# Patient Record
Sex: Female | Born: 1960
Health system: Southern US, Community
[De-identification: ages and names within clinical notes are randomized; demographics above are authoritative.]

## PROBLEM LIST (undated history)

## (undated) DIAGNOSIS — I10 Essential (primary) hypertension: Secondary | ICD-10-CM

## (undated) DIAGNOSIS — C801 Malignant (primary) neoplasm, unspecified: Secondary | ICD-10-CM

## (undated) DIAGNOSIS — F419 Anxiety disorder, unspecified: Secondary | ICD-10-CM

## (undated) HISTORY — PX: ABDOMINAL HYSTERECTOMY: SHX81

---

## 1998-04-23 HISTORY — PX: BREAST SURGERY: SHX581

## 1998-04-23 HISTORY — PX: BREAST EXCISIONAL BIOPSY: SUR124

## 1998-04-23 HISTORY — PX: BREAST LUMPECTOMY: SHX2

## 2000-07-26 ENCOUNTER — Encounter (INDEPENDENT_AMBULATORY_CARE_PROVIDER_SITE_OTHER): Payer: Self-pay | Admitting: Specialist

## 2000-07-26 ENCOUNTER — Ambulatory Visit (HOSPITAL_COMMUNITY): Admission: RE | Admit: 2000-07-26 | Discharge: 2000-07-26 | Payer: Self-pay | Admitting: General Surgery

## 2000-08-27 ENCOUNTER — Encounter (HOSPITAL_BASED_OUTPATIENT_CLINIC_OR_DEPARTMENT_OTHER): Payer: Self-pay | Admitting: General Surgery

## 2000-08-29 ENCOUNTER — Encounter (INDEPENDENT_AMBULATORY_CARE_PROVIDER_SITE_OTHER): Payer: Self-pay | Admitting: Specialist

## 2000-08-29 ENCOUNTER — Encounter (HOSPITAL_BASED_OUTPATIENT_CLINIC_OR_DEPARTMENT_OTHER): Payer: Self-pay | Admitting: General Surgery

## 2000-08-29 ENCOUNTER — Ambulatory Visit (HOSPITAL_COMMUNITY): Admission: RE | Admit: 2000-08-29 | Discharge: 2000-08-29 | Payer: Self-pay | Admitting: General Surgery

## 2000-10-02 ENCOUNTER — Ambulatory Visit: Admission: RE | Admit: 2000-10-02 | Discharge: 2000-12-31 | Payer: Self-pay | Admitting: Radiation Oncology

## 2001-04-23 HISTORY — PX: BREAST BIOPSY: SHX20

## 2002-01-05 ENCOUNTER — Encounter: Admission: RE | Admit: 2002-01-05 | Discharge: 2002-01-05 | Payer: Self-pay | Admitting: Family Medicine

## 2002-01-05 ENCOUNTER — Encounter: Payer: Self-pay | Admitting: Family Medicine

## 2004-08-27 ENCOUNTER — Emergency Department (HOSPITAL_COMMUNITY): Admission: EM | Admit: 2004-08-27 | Discharge: 2004-08-27 | Payer: Self-pay | Admitting: Family Medicine

## 2005-02-14 ENCOUNTER — Other Ambulatory Visit: Admission: RE | Admit: 2005-02-14 | Discharge: 2005-02-14 | Payer: Self-pay | Admitting: Family Medicine

## 2005-02-27 ENCOUNTER — Encounter: Admission: RE | Admit: 2005-02-27 | Discharge: 2005-02-27 | Payer: Self-pay | Admitting: Family Medicine

## 2005-02-27 ENCOUNTER — Encounter (INDEPENDENT_AMBULATORY_CARE_PROVIDER_SITE_OTHER): Payer: Self-pay | Admitting: Specialist

## 2005-02-27 ENCOUNTER — Encounter (INDEPENDENT_AMBULATORY_CARE_PROVIDER_SITE_OTHER): Payer: Self-pay | Admitting: Diagnostic Radiology

## 2005-03-13 ENCOUNTER — Encounter: Admission: RE | Admit: 2005-03-13 | Discharge: 2005-03-13 | Payer: Self-pay | Admitting: General Surgery

## 2005-03-20 ENCOUNTER — Encounter: Admission: RE | Admit: 2005-03-20 | Discharge: 2005-03-20 | Payer: Self-pay | Admitting: Family Medicine

## 2005-04-03 ENCOUNTER — Encounter: Admission: RE | Admit: 2005-04-03 | Discharge: 2005-04-03 | Payer: Self-pay | Admitting: General Surgery

## 2005-04-25 ENCOUNTER — Ambulatory Visit (HOSPITAL_BASED_OUTPATIENT_CLINIC_OR_DEPARTMENT_OTHER): Admission: RE | Admit: 2005-04-25 | Discharge: 2005-04-25 | Payer: Self-pay | Admitting: General Surgery

## 2005-04-25 ENCOUNTER — Encounter (HOSPITAL_BASED_OUTPATIENT_CLINIC_OR_DEPARTMENT_OTHER): Payer: Self-pay | Admitting: General Surgery

## 2005-04-25 ENCOUNTER — Encounter (INDEPENDENT_AMBULATORY_CARE_PROVIDER_SITE_OTHER): Payer: Self-pay | Admitting: *Deleted

## 2005-05-22 ENCOUNTER — Ambulatory Visit: Admission: RE | Admit: 2005-05-22 | Discharge: 2005-05-31 | Payer: Self-pay | Admitting: Radiation Oncology

## 2005-06-13 ENCOUNTER — Ambulatory Visit: Payer: Self-pay | Admitting: Oncology

## 2005-06-18 ENCOUNTER — Ambulatory Visit: Admission: RE | Admit: 2005-06-18 | Discharge: 2005-07-06 | Payer: Self-pay | Admitting: Radiation Oncology

## 2005-07-17 ENCOUNTER — Ambulatory Visit: Admission: RE | Admit: 2005-07-17 | Discharge: 2005-09-21 | Payer: Self-pay | Admitting: Radiation Oncology

## 2006-04-24 ENCOUNTER — Emergency Department (HOSPITAL_COMMUNITY): Admission: EM | Admit: 2006-04-24 | Discharge: 2006-04-24 | Payer: Self-pay | Admitting: Family Medicine

## 2006-11-29 ENCOUNTER — Encounter: Admission: RE | Admit: 2006-11-29 | Discharge: 2006-11-29 | Payer: Self-pay | Admitting: General Surgery

## 2006-12-17 ENCOUNTER — Other Ambulatory Visit: Admission: RE | Admit: 2006-12-17 | Discharge: 2006-12-17 | Payer: Self-pay | Admitting: Family Medicine

## 2008-11-09 ENCOUNTER — Encounter: Admission: RE | Admit: 2008-11-09 | Discharge: 2008-11-09 | Payer: Self-pay | Admitting: Family Medicine

## 2008-11-11 ENCOUNTER — Encounter: Admission: RE | Admit: 2008-11-11 | Discharge: 2008-11-11 | Payer: Self-pay | Admitting: Family Medicine

## 2009-07-20 ENCOUNTER — Other Ambulatory Visit: Admission: RE | Admit: 2009-07-20 | Discharge: 2009-07-20 | Payer: Self-pay | Admitting: Family Medicine

## 2010-05-13 ENCOUNTER — Encounter: Payer: Self-pay | Admitting: General Surgery

## 2010-05-13 ENCOUNTER — Encounter: Payer: Self-pay | Admitting: Family Medicine

## 2010-09-08 NOTE — Op Note (Signed)
NAMEKENLYN, LOSE             ACCOUNT NO.:  [id number]   MEDICAL RECORD NO.:  [id number]          PATIENT TYPE:  AMB   LOCATION:  DSC                          FACILITY:  [facility]   PHYSICIAN:  Leonie Man, M.D.   DATE OF BIRTH:  October 22, 1960   DATE OF PROCEDURE:  04/25/2005  DATE OF DISCHARGE:                                 OPERATIVE REPORT   PREOPERATIVE DIAGNOSIS:  Carcinoma left breast.   POSTOPERATIVE DIAGNOSIS:  Carcinoma left breast.   PROCEDURE:  Partial mastectomy, left and left sentinel lymph node biopsy.   SURGEON:  Ballen   ASSISTANT:  OR nurse.   ANESTHESIA:  General.   NOTE:  The patient is a 50 year old woman who in 2002 underwent a partial  mastectomy and sentinel lymph node biopsies of the right breast. She was on  tamoxifen for short period of time and underwent breast radiation therapy.  She returns now with a palpable mass within the left breast which on biopsy  shows an invasive carcinoma. She comes to the operating room now after the  risks and potential benefits of surgery have been discussed, all questions  answered and consent obtained.   Following induction of satisfactory general anesthesia, the patient is again  positively identified and left breast identified. The left breast was then  prepped and draped to be included in a sterile operative field. Transverse  incision is carried down just above the mass. This was deepened, raising  flaps superiorly, inferiorly and dissecting around the entire mass, carrying  the dissection down to the chest wall. The entire wedge of breast tissue was  removed in its entirety and forwarded for pathologic evaluation. Touch Prep  on the margin showed no evidence of carcinoma. Hemostasis was obtained  within the incision and attention then turned to the left axilla.  With the  use of the NeoProbe, a hot spot within the axilla was identified and a  transverse axillary incision was made, deepened through skin and  subcutaneous tissue with dissection carried down to the lymph node which was  blue and hot with counts of greater than 2000. This was dissected free,  removed and forwarded for pathologic evaluation. No additional lymph nodes  above background could be identified within the axilla. Touch Preps on the  node were negative for carcinoma. Hemostasis was assured within both wounds.  The axillary wound closed first with deep sutures placed with 2-0 Vicryl  suture and then the skin was closed with 5-0 Monocryl suture. The breast  incision itself was also closed in two layers with 2-0 Vicryl sutures and  then 5-0 Monocryl suture in the subcuticular a position. The wounds then  reinforced forced with Steri-Strips and sterile compressive dressings  applied after Marcaine 0.25% with epinephrine was injected both into the  axilla and into the breast around the incisions.  After dressings were  applied, the patient was removed from the operating room to the recovery  room in stable condition. She tolerated the procedure well.      Leonie Man, M.D.  Electronically Signed     PB/MEDQ  D:  04/25/2005  T:  04/25/2005  Job:  [id number]   cc:   Renaye Rakers, M.D.  Fax: [phone]

## 2010-09-08 NOTE — Op Note (Signed)
[facility]  Patient:    Allison Rivera, Allison Rivera                    MRN: [id number] Proc. Date: 08/29/00 Adm. Date:  [id number] Attending:  Sonda Primes                           Operative Report  PREOPERATIVE DIAGNOSIS:  Carcinoma, right breast.  POSTOPERATIVE DIAGNOSIS:  Carcinoma, right breast.  PROCEDURE:  Sentinel lymph node biopsy.  SURGEON:  Mardene Celeste. Lurene Shadow, M.D.  ASSISTANT:  Nurse.  ANESTHESIA:  General.  CLINICAL NOTE:  The patient is a 50 year old woman status post biopsy of a lesion in the axillary tail of the right breast, which returned as grade 1 carcinoma.  She returns to the operating room now for a sentinel lymph node biopsy, possible axillary lymph node dissection.  DESCRIPTION OF PROCEDURE:  Following the induction of anesthesia, the patient, who had been previously injected with Technetium sulfur colloid and then injected with lymphazurin dye in the periareolar region and having this area massaged for approximately five minutes.  The right breast and axilla are prepped and draped to be included in the sterile operative field.  I used the NeoProbe to locate and map the hot spot within the axilla.  I made an incision over the axillary hot spot and deepened this through the skin and subcutaneous tissue down to the region where a cluster of blue lymph nodes was noted.  The largest of the lymph nodes was dissected free and removed.  It was radioactive.  Three additional nodes were also radioactive, and these too were dissected free and forwarded for pathologic evaluation.  The large and primary node was treated by touch prep and was noted to be negative for metastatic tumor.  Hemostasis was obtained within the wound.  Sponge, instrument, and sharp counts were verified.  The subcutaneous tissue was closed with interrupted 3-0 Vicryl sutures and the skin closed with 5-0 Monocryl suture. The wounds was reinforced with  Steri-Strips and sterile dressings applied. Anesthetic reversed.  The patient removed from the operating room to the recovery room in stable condition, having tolerated the procedure well. DD:  08/29/00 TD:  08/29/00 Job: [id number]

## 2010-10-05 ENCOUNTER — Other Ambulatory Visit: Payer: Self-pay | Admitting: Family Medicine

## 2010-10-05 ENCOUNTER — Other Ambulatory Visit (HOSPITAL_COMMUNITY)
Admission: RE | Admit: 2010-10-05 | Discharge: 2010-10-05 | Disposition: A | Discharge status: Home / Self Care | Payer: BC Managed Care – PPO | Source: Ambulatory Visit | Attending: Family Medicine | Admitting: Family Medicine

## 2010-10-05 DIAGNOSIS — Z01419 Encounter for gynecological examination (general) (routine) without abnormal findings: Secondary | ICD-10-CM | POA: Insufficient documentation

## 2010-10-10 ENCOUNTER — Other Ambulatory Visit: Payer: Self-pay | Admitting: Family Medicine

## 2010-10-10 DIAGNOSIS — N939 Abnormal uterine and vaginal bleeding, unspecified: Secondary | ICD-10-CM

## 2010-10-13 ENCOUNTER — Other Ambulatory Visit: Payer: Self-pay

## 2010-10-13 ENCOUNTER — Inpatient Hospital Stay: Admission: RE | Admit: 2010-10-13 | Payer: Self-pay | Source: Ambulatory Visit

## 2010-10-20 ENCOUNTER — Other Ambulatory Visit: Payer: BC Managed Care – PPO

## 2011-04-24 HISTORY — PX: COLONOSCOPY: SHX174

## 2011-11-02 ENCOUNTER — Other Ambulatory Visit: Payer: Self-pay | Admitting: Family Medicine

## 2011-11-02 DIAGNOSIS — Z1231 Encounter for screening mammogram for malignant neoplasm of breast: Secondary | ICD-10-CM

## 2011-11-07 ENCOUNTER — Ambulatory Visit
Admission: RE | Admit: 2011-11-07 | Discharge: 2011-11-07 | Disposition: A | Discharge status: Home / Self Care | Payer: BC Managed Care – PPO | Source: Ambulatory Visit | Attending: Family Medicine | Admitting: Family Medicine

## 2011-11-07 DIAGNOSIS — Z1231 Encounter for screening mammogram for malignant neoplasm of breast: Secondary | ICD-10-CM

## 2011-11-15 ENCOUNTER — Other Ambulatory Visit: Payer: Self-pay | Admitting: Obstetrics and Gynecology

## 2011-11-23 ENCOUNTER — Encounter (HOSPITAL_COMMUNITY): Payer: Self-pay

## 2011-12-05 ENCOUNTER — Encounter (HOSPITAL_COMMUNITY)
Admission: RE | Admit: 2011-12-05 | Discharge: 2011-12-05 | Disposition: A | Discharge status: Home / Self Care | Payer: BC Managed Care – PPO | Source: Ambulatory Visit | Attending: Obstetrics and Gynecology | Admitting: Obstetrics and Gynecology

## 2011-12-05 ENCOUNTER — Encounter (HOSPITAL_COMMUNITY): Payer: Self-pay

## 2011-12-05 HISTORY — DX: Malignant (primary) neoplasm, unspecified: C80.1

## 2011-12-05 HISTORY — DX: Anxiety disorder, unspecified: F41.9

## 2011-12-05 HISTORY — DX: Essential (primary) hypertension: I10

## 2011-12-05 LAB — CBC
HCT: 41.7 % (ref 36.0–46.0)
MCH: 27.9 pg (ref 26.0–34.0)
MCHC: 32.1 g/dL (ref 30.0–36.0)
MCV: 86.7 fL (ref 78.0–100.0)
Platelets: 371 10*3/uL (ref 150–400)
RBC: 4.81 MIL/uL (ref 3.87–5.11)
RDW: 13.3 % (ref 11.5–15.5)
WBC: 8.8 10*3/uL (ref 4.0–10.5)

## 2011-12-05 LAB — BASIC METABOLIC PANEL
BUN: 15 mg/dL (ref 6–23)
CO2: 31 mEq/L (ref 19–32)
Chloride: 102 mEq/L (ref 96–112)
Creatinine, Ser: 0.84 mg/dL (ref 0.50–1.10)
GFR calc Af Amer: >90 mL/min (ref >90)
GFR calc non Af Amer: 79 mL/min — ABNORMAL LOW (ref >90)
Glucose, Bld: 98 mg/dL (ref 70–99)
Potassium: 4.3 mEq/L (ref 3.5–5.1)
Sodium: 141 mEq/L (ref 135–145)

## 2011-12-05 NOTE — Patient Instructions (Signed)
Your procedure is scheduled on:12/07/11  Enter through the Main Entrance at :1045am Pick up desk phone and dial [phone] and inform us of your arrival.  Please call [phone] if you have any problems the morning of surgery.  Remember: Do not eat after midnight:Thursday Do not drink after:water ok until 8am on Fri  Take these meds the morning of surgery with a sip of water:none  DO NOT wear jewelry, eye make-up, lipstick,body lotion, or dark fingernail polish. Do not shave for 48 hours prior to surgery.  If you are to be admitted after surgery, leave suitcase in car until your room has been assigned. Patients discharged on the day of surgery will not be allowed to drive home.   Remember to use your Hibiclens as instructed.

## 2011-12-07 ENCOUNTER — Encounter (HOSPITAL_COMMUNITY): Payer: Self-pay | Admitting: Anesthesiology

## 2011-12-07 ENCOUNTER — Ambulatory Visit (HOSPITAL_COMMUNITY)
Admission: RE | Admit: 2011-12-07 | Discharge: 2011-12-07 | Disposition: A | Discharge status: Home / Self Care | Payer: BC Managed Care – PPO | Source: Ambulatory Visit | Attending: Obstetrics and Gynecology | Admitting: Obstetrics and Gynecology

## 2011-12-07 ENCOUNTER — Encounter (HOSPITAL_COMMUNITY)
Admission: RE | Disposition: A | Discharge status: Home / Self Care | Payer: Self-pay | Source: Ambulatory Visit | Attending: Obstetrics and Gynecology

## 2011-12-07 ENCOUNTER — Ambulatory Visit (HOSPITAL_COMMUNITY): Payer: BC Managed Care – PPO | Admitting: Anesthesiology

## 2011-12-07 DIAGNOSIS — N924 Excessive bleeding in the premenopausal period: Secondary | ICD-10-CM

## 2011-12-07 DIAGNOSIS — Z01818 Encounter for other preprocedural examination: Secondary | ICD-10-CM | POA: Insufficient documentation

## 2011-12-07 DIAGNOSIS — R9389 Abnormal findings on diagnostic imaging of other specified body structures: Secondary | ICD-10-CM | POA: Insufficient documentation

## 2011-12-07 DIAGNOSIS — Z01812 Encounter for preprocedural laboratory examination: Secondary | ICD-10-CM | POA: Insufficient documentation

## 2011-12-07 HISTORY — PX: DILATION AND CURETTAGE OF UTERUS: SHX78

## 2011-12-07 SURGERY — DILATATION & CURETTAGE/HYSTEROSCOPY WITH VERSAPOINT RESECTION
Anesthesia: General | Site: Vagina | Wound class: Clean Contaminated

## 2011-12-07 MED ORDER — LIDOCAINE HCL (CARDIAC) 20 MG/ML IV SOLN
INTRAVENOUS | Status: DC | PRN
  Administered 2011-12-07: 80 mg via INTRAVENOUS

## 2011-12-07 MED ORDER — DEXAMETHASONE SODIUM PHOSPHATE 10 MG/ML IJ SOLN
INTRAMUSCULAR | Status: AC
  Filled 2011-12-07: qty 1

## 2011-12-07 MED ORDER — FENTANYL CITRATE 0.05 MG/ML IJ SOLN
INTRAMUSCULAR | Status: AC
  Filled 2011-12-07: qty 2

## 2011-12-07 MED ORDER — MIDAZOLAM HCL 2 MG/2ML IJ SOLN
INTRAMUSCULAR | Status: AC
  Filled 2011-12-07: qty 2

## 2011-12-07 MED ORDER — KETOROLAC TROMETHAMINE 60 MG/2ML IM SOLN
INTRAMUSCULAR | Status: AC
  Filled 2011-12-07: qty 2

## 2011-12-07 MED ORDER — KETOROLAC TROMETHAMINE 30 MG/ML IJ SOLN
INTRAMUSCULAR | Status: DC | PRN
  Administered 2011-12-07: 60 mg via INTRAVENOUS

## 2011-12-07 MED ORDER — PROPOFOL 10 MG/ML IV EMUL
INTRAVENOUS | Status: DC | PRN
  Administered 2011-12-07: 170 mg via INTRAVENOUS

## 2011-12-07 MED ORDER — LIDOCAINE HCL (CARDIAC) 20 MG/ML IV SOLN
INTRAVENOUS | Status: AC
  Filled 2011-12-07: qty 5

## 2011-12-07 MED ORDER — PROPOFOL 10 MG/ML IV EMUL
INTRAVENOUS | Status: AC
  Filled 2011-12-07: qty 20

## 2011-12-07 MED ORDER — CHLOROPROCAINE HCL 1 % IJ SOLN
INTRAMUSCULAR | Status: AC
  Filled 2011-12-07: qty 30

## 2011-12-07 MED ORDER — PHENYLEPHRINE HCL 10 MG/ML IJ SOLN
INTRAMUSCULAR | Status: DC | PRN
  Administered 2011-12-07 (×3): 40 ug via INTRAVENOUS
  Administered 2011-12-07: 80 ug via INTRAVENOUS

## 2011-12-07 MED ORDER — FENTANYL CITRATE 0.05 MG/ML IJ SOLN
25.0000 ug | INTRAMUSCULAR | Status: DC | PRN
  Administered 2011-12-07: 50 ug via INTRAVENOUS

## 2011-12-07 MED ORDER — DEXAMETHASONE SODIUM PHOSPHATE 4 MG/ML IJ SOLN
INTRAMUSCULAR | Status: DC | PRN
Start: 2011-12-07 — End: 2011-12-07
  Administered 2011-12-07: 10 mg via INTRAVENOUS

## 2011-12-07 MED ORDER — GLYCINE 1.5 % IR SOLN
Status: DC | PRN
  Administered 2011-12-07: 3000 mL

## 2011-12-07 MED ORDER — LACTATED RINGERS IV SOLN
INTRAVENOUS | Status: DC
  Administered 2011-12-07 (×4): via INTRAVENOUS

## 2011-12-07 MED ORDER — ONDANSETRON HCL 4 MG/2ML IJ SOLN
INTRAMUSCULAR | Status: AC
  Filled 2011-12-07: qty 2

## 2011-12-07 MED ORDER — ONDANSETRON HCL 4 MG/2ML IJ SOLN
INTRAMUSCULAR | Status: DC | PRN
  Administered 2011-12-07: 4 mg via INTRAVENOUS

## 2011-12-07 MED ORDER — IBUPROFEN 800 MG PO TABS: 800.0000 mg | ORAL_TABLET | Freq: Three times a day (TID) | ORAL | Status: AC | PRN

## 2011-12-07 MED ORDER — MIDAZOLAM HCL 5 MG/5ML IJ SOLN
INTRAMUSCULAR | Status: DC | PRN
  Administered 2011-12-07: 2 mg via INTRAVENOUS

## 2011-12-07 MED ORDER — FENTANYL CITRATE 0.05 MG/ML IJ SOLN
INTRAMUSCULAR | Status: DC | PRN
  Administered 2011-12-07 (×2): 50 ug via INTRAVENOUS

## 2011-12-07 MED ORDER — CHLOROPROCAINE HCL 1 % IJ SOLN
INTRAMUSCULAR | Status: DC | PRN
  Administered 2011-12-07: 20 mL

## 2011-12-07 MED ORDER — FENTANYL CITRATE 0.05 MG/ML IJ SOLN
INTRAMUSCULAR | Status: AC
  Administered 2011-12-07: 50 ug via INTRAVENOUS
  Filled 2011-12-07: qty 2

## 2011-12-07 SURGICAL SUPPLY — 17 items
CANISTER SUCTION 2500CC (MISCELLANEOUS) ×2 IMPLANT
CATH ROBINSON RED A/P 16FR (CATHETERS) ×2 IMPLANT
CLOTH BEACON ORANGE TIMEOUT ST (SAFETY) ×2 IMPLANT
CONTAINER PREFILL 10% NBF 60ML (FORM) ×4 IMPLANT
ELECT REM PT RETURN 9FT ADLT (ELECTROSURGICAL) ×2
ELECTRODE REM PT RTRN 9FT ADLT (ELECTROSURGICAL) ×1 IMPLANT
ELECTRODE ROLLER VERSAPOINT (ELECTRODE) ×1 IMPLANT
ELECTRODE RT ANGLE VERSAPOINT (CUTTING LOOP) IMPLANT
GLOVE BIO SURGEON STRL SZ 6.5 (GLOVE) ×2 IMPLANT
GLOVE BIOGEL PI IND STRL 7.0 (GLOVE) ×2 IMPLANT
GLOVE BIOGEL PI INDICATOR 7.0 (GLOVE) ×2
GOWN PREVENTION PLUS LG XLONG (DISPOSABLE) ×4 IMPLANT
GOWN STRL REIN XL XLG (GOWN DISPOSABLE) ×2 IMPLANT
LOOP ANGLED CUTTING 22FR (CUTTING LOOP) IMPLANT
PACK HYSTEROSCOPY LF (CUSTOM PROCEDURE TRAY) ×2 IMPLANT
TOWEL OR 17X24 6PK STRL BLUE (TOWEL DISPOSABLE) ×4 IMPLANT
WATER STERILE IRR 1000ML POUR (IV SOLUTION) ×2 IMPLANT

## 2011-12-07 NOTE — Anesthesia Preprocedure Evaluation (Signed)
Anesthesia Evaluation  Patient identified by MRN, date of birth, ID band Patient awake    Reviewed: Allergy & Precautions, H&P , Patient's Chart, lab work & pertinent test results, reviewed documented beta blocker date and time   Airway Mallampati: II TM Distance: >3 FB Neck ROM: full    Dental No notable dental hx.    Pulmonary  breath sounds clear to auscultation  Pulmonary exam normal       Cardiovascular hypertension (well controlled), Pt. on medications Rhythm:regular Rate:Normal     Neuro/Psych    GI/Hepatic   Endo/Other    Renal/GU      Musculoskeletal   Abdominal   Peds  Hematology   Anesthesia Other Findings   Reproductive/Obstetrics                           Anesthesia Physical Anesthesia Plan  ASA: II  Anesthesia Plan: General   Post-op Pain Management:    Induction: Intravenous  Airway Management Planned: LMA  Additional Equipment:   Intra-op Plan:   Post-operative Plan:   Informed Consent: I have reviewed the patients History and Physical, chart, labs and discussed the procedure including the risks, benefits and alternatives for the proposed anesthesia with the patient or authorized representative who has indicated his/her understanding and acceptance.   Dental Advisory Given  Plan Discussed with: CRNA and Surgeon  Anesthesia Plan Comments: (  Discussed  general anesthesia, including possible nausea, instrumentation of airway, sore throat,pulmonary aspiration, etc. I asked if the were any outstanding questions, or  concerns before we proceeded. )        Anesthesia Quick Evaluation

## 2011-12-07 NOTE — Brief Op Note (Signed)
12/07/2011  1:12 PM  PATIENT:  Allison Rivera  51 y.o. female  PRE-OPERATIVE DIAGNOSIS:  Perimenopausal Bleeding, Endometrial Polyps, endometrial thickening  POST-OPERATIVE DIAGNOSIS:   Perimenopausal menopausal bleeding, endometrial thickening  PROCEDURE:  Procedure(s) (LRB): DILATATION & CURETTAGE,   DIAGNOSTIC  HYSTEROSCOPY WITH VERSAPOINT RESECTION (N/A)  SURGEON:  Surgeon(s) and Role:    * Ilija Maxim A Daxten Kovalenko, MD - Primary  PHYSICIAN ASSISTANT:   ASSISTANTS: none   ANESTHESIA:   general and paracervical block  EBL:  Total I/O In: 1400 [I.V.:1400] Out: 20 [Blood:20] FINDINGS: diffuse thickened polypoid lesions noted with tortuous blood vessels. Large amount of tissue retrieved BLOOD ADMINISTERED:none  DRAINS: none   LOCAL MEDICATIONS USED:  OTHER 1% nesicaine  SPECIMEN:  Source of Specimen:  emc  DISPOSITION OF SPECIMEN:  PATHOLOGY  COUNTS:  YES  TOURNIQUET:  * No tourniquets in log *  DICTATION: .Other Dictation: Dictation Number [id number]  PLAN OF CARE: Discharge to home after PACU  PATIENT DISPOSITION:  PACU - hemodynamically stable.   Delay start of Pharmacological VTE agent (>24hrs) due to surgical blood loss or risk of bleeding: no

## 2011-12-07 NOTE — Anesthesia Postprocedure Evaluation (Signed)
  Anesthesia Post-op Note  Patient: Allison Rivera  Procedure(s) Performed: Procedure(s) (LRB): DILATATION & CURETTAGE/HYSTEROSCOPY WITH VERSAPOINT RESECTION (N/A)  Patient is awake and responsive. Pain and nausea are reasonably well controlled. Vital signs are stable and clinically acceptable. Oxygen saturation is clinically acceptable. There are no apparent anesthetic complications at this time. Patient is ready for discharge.

## 2011-12-07 NOTE — Preoperative (Signed)
Beta Blockers   Reason not to administer Beta Blockers:Not Applicable 

## 2011-12-07 NOTE — Transfer of Care (Signed)
Immediate Anesthesia Transfer of Care Note  Patient: Allison Rivera  Procedure(s) Performed: Procedure(s) (LRB): DILATATION & CURETTAGE/HYSTEROSCOPY WITH VERSAPOINT RESECTION (N/A)  Patient Location: PACU  Anesthesia Type: General  Level of Consciousness: awake, alert , oriented and patient cooperative  Airway & Oxygen Therapy: Patient Spontanous Breathing and Patient connected to nasal cannula oxygen  Post-op Assessment: Report given to PACU RN and Post -op Vital signs reviewed and stable  Post vital signs: Reviewed and stable  Complications: No apparent anesthesia complications

## 2011-12-08 NOTE — Op Note (Signed)
NAMEBLAKELEY, Allison Rivera NO.:  [id number]  MEDICAL RECORD NO.:  [id number]  LOCATION:  [facility]                          FACILITY:  [facility]  PHYSICIAN:  Maxie Better, M.D.DATE OF BIRTH:  [birth year]  DATE OF PROCEDURE:  12/07/2011 DATE OF DISCHARGE:  12/07/2011                              OPERATIVE REPORT   PREOPERATIVE DIAGNOSIS:  Abnormal perimenopausal bleeding, endometrial thickening.  PROCEDURE:  Diagnostic hysteroscopy, dilation and curettage.  POSTOPERATIVE DIAGNOSIS:  Abnormal perimenopausal bleeding, endometrial thickening.  ANESTHESIA:  General, paracervical block.  SURGEON:  Maxie Better, MD.  ASSISTANT:  None.  PROCEDURE:  Under adequate general anesthesia, the patient was placed in the dorsal lithotomy position.  She was sterilely prepped and draped in usual fashion.  The bladder was catheterized and minimal amount of urine.  Examination under anesthesia revealed an anteverted uterus.  No adnexal masses could be appreciated.  A bivalve speculum was placed in the vagina.  1% Nesacaine was injected paracervically at 3 and 9 o'clock.  The anterior lip of the cervix grasped with a single-tooth tenaculum.  The cervix was then serially dilated up to #31 [facility] dilator.  Versapoint apparatus introduced into the uterine cavity.  At that point, there were diffuse polypoid lesions in the endometrial cavity with a prominent large irregular blood vessels noted.  There were no areas of normalcy seen.  The hysteroscope was removed.  The cavity was curetted for a large amount of tissue.  The hysteroscope was then reinserted and additional tissue was there, however, given the appearance of irregular polypoid diffuse with multiple abnormal appearing vessels, decision was then made not to proceed with aggressive curettage of the uterine cavity.  Therefore, the resectoscope was removed.  The tenaculum was removed.  All instruments from the vagina were removed  and specimen labeled.  Endometrial curetting was sent to pathology.  COMPLICATION:  None.  The patient tolerated the procedure well and was transferred to recovery in stable condition.     Maxie Better, M.D.    [place]/MEDQ  D:  12/07/2011  T:  12/08/2011  Job:  [id number]

## 2011-12-20 ENCOUNTER — Encounter: Payer: Self-pay | Admitting: Gynecologic Oncology

## 2011-12-20 ENCOUNTER — Ambulatory Visit: Payer: BC Managed Care – PPO | Attending: Gynecologic Oncology | Admitting: Gynecologic Oncology

## 2011-12-20 VITALS — BP 118/60 | HR 90 | Temp 98.2°F | Resp 22 | Ht 69.0 in | Wt 141.5 lb

## 2011-12-20 DIAGNOSIS — C50919 Malignant neoplasm of unspecified site of unspecified female breast: Secondary | ICD-10-CM | POA: Insufficient documentation

## 2011-12-20 DIAGNOSIS — C541 Malignant neoplasm of endometrium: Secondary | ICD-10-CM | POA: Insufficient documentation

## 2011-12-20 DIAGNOSIS — I10 Essential (primary) hypertension: Secondary | ICD-10-CM | POA: Insufficient documentation

## 2011-12-20 DIAGNOSIS — C549 Malignant neoplasm of corpus uteri, unspecified: Secondary | ICD-10-CM | POA: Insufficient documentation

## 2011-12-20 NOTE — Progress Notes (Addendum)
Consult Note: Gyn-Onc  Allison Rivera 51 y.o. female  CC:  Chief Complaint  Patient presents with  . Endometrial cancer    New consult    HPI: Patient is seen today in consultation at the request of Dr. Cherly Hensen.  Allison Rivera is a very pleasant 51 year old gravida 1 para 1 who in February of this year turning the age of 51 she began noticing that her cycles are becoming more sporadic and somewhat irregular. She then began having some irregular spotting. She was seen by Dr. Parke Simmers and had a negative exam and negative Pap smear. About 3 months ago the spotting became more persistent and she was bleeding for 3 weeks. She was scheduled to see Dr. Cherly Hensen on but missed that appointment. She then saw Dr. Parke Simmers with these concerns and was rescheduled with Dr. Cherly Hensen. An ultrasound was performed that revealed 3 small uterine fibroids. The fibrous measured approximately 1 x 2 cm each. The uterine size overall was 9.7 x 7.4 x 5.6 cm. However, the endometrium was irregular with a 2.5 cm in thickness. Due to this, the patient underwent D&C on August 16. She also had a diagnostic hysteroscopy. At the time of the hysteroscopy there was diffuse polypoid lesion in the endometrial cavity with prominent irregular blood vessels noted. Pathology returned as a grade 1 endometrioid adenocarcinoma. It is for this reason that she comes in to see [place] today.  Interval History:  She has only had slight spotting since her D&C otherwise is without complaints.  Review of Systems: Review of systems is negative.  Current Meds:  Outpatient Encounter Prescriptions as of 12/20/2011  Medication Sig Dispense Refill  . acetaminophen (TYLENOL) 325 MG tablet Take 650 mg by mouth daily as needed. headache      . Aliskiren-Hydrochlorothiazide (TEKTURNA HCT) 300-25 MG TABS Take 1 tablet by mouth daily.      Marland Kitchen ALPRAZolam (XANAX) 1 MG tablet Take 1 mg by mouth 3 (three) times daily.      Marland Kitchen amLODipine (NORVASC) 5 MG tablet Take 5 mg  by mouth daily.      . Multiple Vitamin (MULTIVITAMIN WITH MINERALS) TABS Take 1 tablet by mouth daily.        Allergy: No Known Allergies  Social Hx:   History   Social History  . Marital Status: Divorced    Spouse Name: N/A    Number of Children: N/A  . Years of Education: N/A   Occupational History  . Not on file.   Social History Main Topics  . Smoking status: Never Smoker   . Smokeless tobacco: Not on file  . Alcohol Use: 0.6 oz/week    1 Glasses of wine per week     occasionally  . Drug Use: No  . Sexually Active: Yes   Other Topics Concern  . Not on file   Social History Narrative  . No narrative on file    Past Surgical Hx: Had a resection of an abdominal tumor when she was 10-11 years. She does not know that it was for. She then subsequently approximately 25 years ago had a plastic surgery repair of the incision secondary to a keloid. Past Surgical History  Procedure Date  . Breast surgery 2000    lumpectomy  . Breast biopsy 2003  . Colonoscopy 2013    Past Medical Hx:  Past Medical History  Diagnosis Date  . Hypertension   . Anxiety   . Cancer     breast  Family Hx: No family history on file. She has a sister with breast cancer at age of 68. She has 3 maternal aunts with breast cancer and 2 maternal first cousins with breast cancer. She is not aware that anyone had any genetic testing. She is up-to-date on her mammograms and 20 months ago that was negative. She had a colonoscopy in February of 2013 that was benign. She did have 2 benign polyps. It was recommended that she have followup in 5 years.  Vitals:  Blood pressure 118/60, pulse 90, temperature 98.2 F (36.8 C), temperature source Oral, resp. rate 22, height 5\' 9"  (1.753 m), weight 141 lb 8 oz (64.184 kg).  Physical Exam: Arched well-developed female in no acute distress.  Neck: Supple, no lymphadenopathy no thyromegaly.  Lungs: Clear to auscultation bilaterally.  Cardiovascular:  Regular rate and rhythm.  Abdomen: Well-healed transverse incision. Abdomen is soft nontender nondistended no palpable mass or prostatomegaly.  Extremities: No edema.  Pelvic: Normal external female genitalia. Vagina is slightly atrophic. The cervix is nulliparous. There are no visible lesions. Bimanual examination the cervix is palpably normal. The corpus is of normal size shape and consistency there is no adnexal masses.  Assessment/Plan:  51 year old with grade 1 endometrioid adenocarcinoma. Options for surgery including hysterectomy BSO and appropriate staging were discussed the patient. We discussed proceeding with laparotomy versus minimally invasive surgery. She will be most interested in minimally invasive surgery. The earliest surgical date that we have available in [place] is September 10. However, as she has met me she would like to defer surgery September 24 so that I will be here in [place].  We discussed the surgical procedure and at the uterus, the cervix, bilateral fallopian tubes and ovaries would be removed. While she is under anesthesia, the uterus will be sent for frozen section. If there's less than 50% myometrial invasion she will not require full lymphadenectomy. If there's greater than 50% myometrial invasion will perform a full lymphadenectomy at the time of her initial surgery. She understands that there is about a 5% risk of needing to convert to laparotomy. Risks and benefits of surgery including but not limited to bleeding, infection, injury to surrounding organs, and  thromboembolic disease were discussed with the patient. Her questions were elicited and answered to her satisfaction. She wishes to proceed. Telford Nab RN reviewed preoperative procedures with the patient. She has our cards and will call us if she has any questions. We appreciate the opportunity to partner in the care of this patient. Cinthya Bors A., MD 12/20/2011, 3:56 PM   Please refer to the  change in the history.  The patient was initially seen by Dr. Parke Simmers and referred to Dr. Cherly Hensen.  The patient did not keep the initial consult with Dr. Cherly Hensen and was re-referred to her office where appropriate biopsies were performed.

## 2011-12-20 NOTE — Patient Instructions (Signed)
Return for pre-operative visit

## 2011-12-21 ENCOUNTER — Other Ambulatory Visit: Payer: Self-pay | Admitting: Obstetrics and Gynecology

## 2012-01-08 ENCOUNTER — Encounter (HOSPITAL_COMMUNITY): Payer: Self-pay | Admitting: Pharmacy Technician

## 2012-01-11 ENCOUNTER — Encounter (HOSPITAL_COMMUNITY): Payer: Self-pay

## 2012-01-11 ENCOUNTER — Encounter (HOSPITAL_COMMUNITY)
Admission: RE | Admit: 2012-01-11 | Discharge: 2012-01-11 | Disposition: A | Discharge status: Home / Self Care | Payer: BC Managed Care – PPO | Source: Ambulatory Visit | Attending: Obstetrics & Gynecology | Admitting: Obstetrics & Gynecology

## 2012-01-11 ENCOUNTER — Ambulatory Visit (HOSPITAL_COMMUNITY)
Admission: RE | Admit: 2012-01-11 | Discharge: 2012-01-11 | Disposition: A | Discharge status: Home / Self Care | Payer: BC Managed Care – PPO | Source: Ambulatory Visit | Attending: Gynecologic Oncology | Admitting: Gynecologic Oncology

## 2012-01-11 DIAGNOSIS — C549 Malignant neoplasm of corpus uteri, unspecified: Secondary | ICD-10-CM | POA: Insufficient documentation

## 2012-01-11 DIAGNOSIS — I10 Essential (primary) hypertension: Secondary | ICD-10-CM | POA: Insufficient documentation

## 2012-01-11 DIAGNOSIS — Z01812 Encounter for preprocedural laboratory examination: Secondary | ICD-10-CM | POA: Insufficient documentation

## 2012-01-11 DIAGNOSIS — M4120 Other idiopathic scoliosis, site unspecified: Secondary | ICD-10-CM | POA: Insufficient documentation

## 2012-01-11 LAB — COMPREHENSIVE METABOLIC PANEL
ALT: 15 U/L (ref 0–35)
AST: 20 U/L (ref 0–37)
CO2: 31 mEq/L (ref 19–32)
Calcium: 9.9 mg/dL (ref 8.4–10.5)
GFR calc non Af Amer: 83 mL/min — ABNORMAL LOW (ref >90)
Sodium: 141 mEq/L (ref 135–145)
Total Protein: 7.8 g/dL (ref 6.0–8.3)

## 2012-01-11 LAB — SURGICAL PCR SCREEN
MRSA, PCR: NEGATIVE
Staphylococcus aureus: POSITIVE — AB

## 2012-01-11 LAB — CBC WITH DIFFERENTIAL/PLATELET
Basophils Absolute: 0 10*3/uL (ref 0.0–0.1)
Eosinophils Relative: 2 % (ref 0–5)
Lymphocytes Relative: 26 % (ref 12–46)
MCV: 85 fL (ref 78.0–100.0)
Platelets: 380 10*3/uL (ref 150–400)
RDW: 13.4 % (ref 11.5–15.5)
WBC: 12.1 10*3/uL — ABNORMAL HIGH (ref 4.0–10.5)

## 2012-01-11 LAB — ABO/RH: ABO/RH(D): B NEG

## 2012-01-11 NOTE — Patient Instructions (Signed)
20      Your procedure is scheduled on:  Tuesday 01/15/2012 at 0715 am  Report to [facility] at  0515 AM.  Call this number if you have problems the morning of surgery: [phone]   Remember:   Do not eat food or drink liquids after midnight!  Take these medicines the morning of surgery with A SIP OF WATER:Xanax if needed    Do not bring valuables to the hospital.  .  Leave suitcase in the car. After surgery it may be brought to your room.  For patients admitted to the hospital, checkout time is 11:00 AM the day of              Discharge.    Special Instructions: See [facility] Preparing  For Surgery Instruction Sheet. Do not wear jewelry, lotions powders, perfumes. Women do not shave legs or underarms for 12 hours before showers. Contacts, partial plates, or dentures may not be worn into surgery.                          Patients discharged the day of surgery will not be allowed to drive home. If going home the same day of surgery, must have someone stay with you  first 24 hrs.at home and arrange for someone to drive you home from the              Hospital.   Please read over the following fact sheets that you were given: MRSA              INFORMATION, Blood transfusion sheet, Incentive Spirometry sheet               Telford Nab.Qamar Rosman,RN,BSN [phone]

## 2012-01-14 NOTE — Anesthesia Preprocedure Evaluation (Addendum)
Anesthesia Evaluation  Patient identified by MRN, date of birth, ID band Patient awake    Reviewed: Allergy & Precautions, H&P , NPO status , Patient's Chart, lab work & pertinent test results, reviewed documented beta blocker date and time   Airway Mallampati: II TM Distance: >3 FB Neck ROM: full    Dental No notable dental hx.    Pulmonary neg pulmonary ROS,  breath sounds clear to auscultation  Pulmonary exam normal       Cardiovascular Exercise Tolerance: Good hypertension, On Medications Rhythm:regular Rate:Normal  CXR and ECG reviewed.   Neuro/Psych negative neurological ROS  negative psych ROS   GI/Hepatic negative GI ROS, Neg liver ROS,   Endo/Other  negative endocrine ROS  Renal/GU negative Renal ROS  negative genitourinary   Musculoskeletal negative musculoskeletal ROS (+)   Abdominal   Peds negative pediatric ROS (+)  Hematology negative hematology ROS (+)   Anesthesia Other Findings   Reproductive/Obstetrics negative OB ROS                          Anesthesia Physical Anesthesia Plan  ASA: II  Anesthesia Plan: General ETT   Post-op Pain Management:    Induction:   Airway Management Planned:   Additional Equipment:   Intra-op Plan:   Post-operative Plan:   Informed Consent: I have reviewed the patients History and Physical, chart, labs and discussed the procedure including the risks, benefits and alternatives for the proposed anesthesia with the patient or authorized representative who has indicated his/her understanding and acceptance.   Dental Advisory Given  Plan Discussed with: CRNA  Anesthesia Plan Comments:         Anesthesia Quick Evaluation

## 2012-01-15 ENCOUNTER — Encounter (HOSPITAL_COMMUNITY): Payer: Self-pay | Admitting: *Deleted

## 2012-01-15 ENCOUNTER — Encounter (HOSPITAL_COMMUNITY): Payer: Self-pay | Admitting: Anesthesiology

## 2012-01-15 ENCOUNTER — Ambulatory Visit (HOSPITAL_COMMUNITY)
Admission: RE | Admit: 2012-01-15 | Discharge: 2012-01-16 | Disposition: A | Discharge status: Home / Self Care | Payer: BC Managed Care – PPO | Source: Ambulatory Visit | Attending: Obstetrics & Gynecology | Admitting: Obstetrics & Gynecology

## 2012-01-15 ENCOUNTER — Encounter (HOSPITAL_COMMUNITY)
Admission: RE | Disposition: A | Discharge status: Home / Self Care | Payer: Self-pay | Source: Ambulatory Visit | Attending: Obstetrics & Gynecology

## 2012-01-15 ENCOUNTER — Ambulatory Visit (HOSPITAL_COMMUNITY): Payer: BC Managed Care – PPO | Admitting: Anesthesiology

## 2012-01-15 DIAGNOSIS — D259 Leiomyoma of uterus, unspecified: Secondary | ICD-10-CM | POA: Insufficient documentation

## 2012-01-15 DIAGNOSIS — C541 Malignant neoplasm of endometrium: Secondary | ICD-10-CM

## 2012-01-15 DIAGNOSIS — N952 Postmenopausal atrophic vaginitis: Secondary | ICD-10-CM | POA: Insufficient documentation

## 2012-01-15 DIAGNOSIS — C549 Malignant neoplasm of corpus uteri, unspecified: Secondary | ICD-10-CM | POA: Insufficient documentation

## 2012-01-15 DIAGNOSIS — Z79899 Other long term (current) drug therapy: Secondary | ICD-10-CM | POA: Insufficient documentation

## 2012-01-15 HISTORY — PX: ROBOTIC ASSISTED TOTAL HYSTERECTOMY WITH BILATERAL SALPINGO OOPHERECTOMY: SHX6086

## 2012-01-15 HISTORY — PX: LYMPH NODE DISSECTION: SHX5087

## 2012-01-15 LAB — TYPE AND SCREEN

## 2012-01-15 SURGERY — ROBOTIC ASSISTED TOTAL HYSTERECTOMY WITH BILATERAL SALPINGO OOPHORECTOMY
Anesthesia: General | Wound class: Clean Contaminated

## 2012-01-15 MED ORDER — CEFAZOLIN SODIUM-DEXTROSE 2-3 GM-% IV SOLR
2.0000 g | INTRAVENOUS | Status: AC
  Administered 2012-01-15: 2 g via INTRAVENOUS

## 2012-01-15 MED ORDER — MORPHINE SULFATE 2 MG/ML IJ SOLN: 2.0000 mg | INTRAMUSCULAR | Status: DC | PRN

## 2012-01-15 MED ORDER — ONDANSETRON HCL 4 MG PO TABS: 4.0000 mg | ORAL_TABLET | Freq: Four times a day (QID) | ORAL | Status: DC | PRN

## 2012-01-15 MED ORDER — ONDANSETRON HCL 4 MG/2ML IJ SOLN
INTRAMUSCULAR | Status: DC | PRN
  Administered 2012-01-15: 4 mg via INTRAVENOUS

## 2012-01-15 MED ORDER — HYDROMORPHONE HCL PF 1 MG/ML IJ SOLN
INTRAMUSCULAR | Status: AC
  Filled 2012-01-15: qty 2

## 2012-01-15 MED ORDER — ACETAMINOPHEN 10 MG/ML IV SOLN
INTRAVENOUS | Status: AC
  Filled 2012-01-15: qty 100

## 2012-01-15 MED ORDER — KETOROLAC TROMETHAMINE 30 MG/ML IJ SOLN
30.0000 mg | Freq: Four times a day (QID) | INTRAMUSCULAR | Status: AC
  Filled 2012-01-15: qty 1

## 2012-01-15 MED ORDER — HYDROMORPHONE HCL PF 1 MG/ML IJ SOLN
0.2500 mg | INTRAMUSCULAR | Status: DC | PRN
  Administered 2012-01-15 (×2): 0.5 mg via INTRAVENOUS

## 2012-01-15 MED ORDER — ALPRAZOLAM 1 MG PO TABS: 1.0000 mg | ORAL_TABLET | Freq: Three times a day (TID) | ORAL | Status: DC | PRN

## 2012-01-15 MED ORDER — AMLODIPINE BESYLATE 5 MG PO TABS
5.0000 mg | ORAL_TABLET | Freq: Every day | ORAL | Status: DC
  Administered 2012-01-15: 5 mg via ORAL
  Filled 2012-01-15 (×2): qty 1

## 2012-01-15 MED ORDER — ONDANSETRON HCL 4 MG/2ML IJ SOLN: 4.0000 mg | Freq: Four times a day (QID) | INTRAMUSCULAR | Status: DC | PRN

## 2012-01-15 MED ORDER — CISATRACURIUM BESYLATE (PF) 10 MG/5ML IV SOLN
INTRAVENOUS | Status: DC | PRN
  Administered 2012-01-15: 2 mg via INTRAVENOUS
  Administered 2012-01-15: 14 mg via INTRAVENOUS

## 2012-01-15 MED ORDER — LACTATED RINGERS IV SOLN
INTRAVENOUS | Status: DC
  Administered 2012-01-15 (×2): via INTRAVENOUS

## 2012-01-15 MED ORDER — KETOROLAC TROMETHAMINE 30 MG/ML IJ SOLN
INTRAMUSCULAR | Status: AC
  Filled 2012-01-15: qty 1

## 2012-01-15 MED ORDER — NEOSTIGMINE METHYLSULFATE 1 MG/ML IJ SOLN
INTRAMUSCULAR | Status: DC | PRN
  Administered 2012-01-15: 4 mg via INTRAVENOUS

## 2012-01-15 MED ORDER — PROPOFOL 10 MG/ML IV BOLUS
INTRAVENOUS | Status: DC | PRN
  Administered 2012-01-15: 150 mg via INTRAVENOUS

## 2012-01-15 MED ORDER — PROMETHAZINE HCL 25 MG/ML IJ SOLN: 6.2500 mg | INTRAMUSCULAR | Status: DC | PRN

## 2012-01-15 MED ORDER — CEFAZOLIN SODIUM-DEXTROSE 2-3 GM-% IV SOLR
INTRAVENOUS | Status: AC
  Filled 2012-01-15: qty 50

## 2012-01-15 MED ORDER — HYDROMORPHONE HCL PF 1 MG/ML IJ SOLN
INTRAMUSCULAR | Status: DC | PRN
  Administered 2012-01-15 (×2): .2 mg via INTRAVENOUS
  Administered 2012-01-15: .6 mg via INTRAVENOUS

## 2012-01-15 MED ORDER — KETOROLAC TROMETHAMINE 30 MG/ML IJ SOLN
30.0000 mg | Freq: Four times a day (QID) | INTRAMUSCULAR | Status: AC
  Administered 2012-01-15 – 2012-01-16 (×3): 30 mg via INTRAVENOUS
  Filled 2012-01-15: qty 1

## 2012-01-15 MED ORDER — KCL IN DEXTROSE-NACL 20-5-0.45 MEQ/L-%-% IV SOLN
INTRAVENOUS | Status: DC
  Administered 2012-01-15 – 2012-01-16 (×3): via INTRAVENOUS
  Filled 2012-01-15 (×5): qty 1000

## 2012-01-15 MED ORDER — KCL IN DEXTROSE-NACL 20-5-0.45 MEQ/L-%-% IV SOLN
INTRAVENOUS | Status: AC
  Filled 2012-01-15: qty 1000

## 2012-01-15 MED ORDER — LACTATED RINGERS IV SOLN
INTRAVENOUS | Status: DC | PRN
  Administered 2012-01-15: 1000 mL

## 2012-01-15 MED ORDER — LIDOCAINE HCL (CARDIAC) 20 MG/ML IV SOLN
INTRAVENOUS | Status: DC | PRN
  Administered 2012-01-15: 100 mg via INTRAVENOUS

## 2012-01-15 MED ORDER — ZOLPIDEM TARTRATE 5 MG PO TABS: 5.0000 mg | ORAL_TABLET | Freq: Every evening | ORAL | Status: DC | PRN

## 2012-01-15 MED ORDER — STERILE WATER FOR IRRIGATION IR SOLN
Status: DC | PRN
  Administered 2012-01-15: 1500 mL

## 2012-01-15 MED ORDER — DEXAMETHASONE SODIUM PHOSPHATE 10 MG/ML IJ SOLN
INTRAMUSCULAR | Status: DC | PRN
  Administered 2012-01-15: 10 mg via INTRAVENOUS

## 2012-01-15 MED ORDER — GLYCOPYRROLATE 0.2 MG/ML IJ SOLN
INTRAMUSCULAR | Status: DC | PRN
  Administered 2012-01-15: .6 mg via INTRAVENOUS

## 2012-01-15 MED ORDER — SUFENTANIL CITRATE 50 MCG/ML IV SOLN
INTRAVENOUS | Status: DC | PRN
  Administered 2012-01-15 (×2): 20 ug via INTRAVENOUS
  Administered 2012-01-15: 10 ug via INTRAVENOUS

## 2012-01-15 MED ORDER — LABETALOL HCL 5 MG/ML IV SOLN
INTRAVENOUS | Status: DC | PRN
  Administered 2012-01-15 (×2): 5 mg via INTRAVENOUS

## 2012-01-15 MED ORDER — OXYCODONE-ACETAMINOPHEN 5-325 MG PO TABS
1.0000 | ORAL_TABLET | ORAL | Status: DC | PRN
  Administered 2012-01-15: 1 via ORAL
  Filled 2012-01-15: qty 1

## 2012-01-15 MED ORDER — MIDAZOLAM HCL 5 MG/5ML IJ SOLN
INTRAMUSCULAR | Status: DC | PRN
  Administered 2012-01-15: 2 mg via INTRAVENOUS

## 2012-01-15 MED ORDER — ACETAMINOPHEN 10 MG/ML IV SOLN
INTRAVENOUS | Status: DC | PRN
  Administered 2012-01-15: 1000 mg via INTRAVENOUS

## 2012-01-15 SURGICAL SUPPLY — 50 items
APL SKNCLS STERI-STRIP NONHPOA (GAUZE/BANDAGES/DRESSINGS)
BAG SPEC RTRVL LRG 6X4 10 (ENDOMECHANICALS) ×2
BENZOIN TINCTURE PRP APPL 2/3 (GAUZE/BANDAGES/DRESSINGS) ×2 IMPLANT
CHLORAPREP W/TINT 26ML (MISCELLANEOUS) ×3 IMPLANT
CLOTH BEACON ORANGE TIMEOUT ST (SAFETY) ×3 IMPLANT
CORD HIGH FREQUENCY UNIPOLAR (ELECTROSURGICAL) ×3 IMPLANT
CORDS BIPOLAR (ELECTRODE) ×3 IMPLANT
COVER MAYO STAND STRL (DRAPES) ×3 IMPLANT
COVER SURGICAL LIGHT HANDLE (MISCELLANEOUS) ×3 IMPLANT
COVER TIP SHEARS 8 DVNC (MISCELLANEOUS) ×2 IMPLANT
COVER TIP SHEARS 8MM DA VINCI (MISCELLANEOUS) ×1
DECANTER SPIKE VIAL GLASS SM (MISCELLANEOUS) ×2 IMPLANT
DRAPE LG THREE QUARTER DISP (DRAPES) ×6 IMPLANT
DRAPE SURG IRRIG POUCH 19X23 (DRAPES) ×3 IMPLANT
DRAPE TABLE BACK 44X90 PK DISP (DRAPES) ×3 IMPLANT
DRAPE UTILITY XL STRL (DRAPES) ×3 IMPLANT
DRAPE WARM FLUID 44X44 (DRAPE) ×3 IMPLANT
DRSG TEGADERM 6X8 (GAUZE/BANDAGES/DRESSINGS) ×6 IMPLANT
ELECT REM PT RETURN 9FT ADLT (ELECTROSURGICAL) ×3
ELECTRODE REM PT RTRN 9FT ADLT (ELECTROSURGICAL) ×2 IMPLANT
GAUZE VASELINE 3X9 (GAUZE/BANDAGES/DRESSINGS) IMPLANT
GLOVE BIO SURGEON STRL SZ 6.5 (GLOVE) ×12 IMPLANT
GLOVE BIO SURGEON STRL SZ7.5 (GLOVE) ×5 IMPLANT
GLOVE BIOGEL PI IND STRL 7.0 (GLOVE) ×4 IMPLANT
GLOVE BIOGEL PI INDICATOR 7.0 (GLOVE) ×2
GOWN PREVENTION PLUS XLARGE (GOWN DISPOSABLE) ×13 IMPLANT
HOLDER FOLEY CATH W/STRAP (MISCELLANEOUS) ×3 IMPLANT
KIT ACCESSORY DA VINCI DISP (KITS) ×1
KIT ACCESSORY DVNC DISP (KITS) ×2 IMPLANT
MANIPULATOR UTERINE 4.5 ZUMI (MISCELLANEOUS) ×3 IMPLANT
OCCLUDER COLPOPNEUMO (BALLOONS) ×3 IMPLANT
PACK LAPAROSCOPY W LONG (CUSTOM PROCEDURE TRAY) ×3 IMPLANT
POUCH SPECIMEN RETRIEVAL 10MM (ENDOMECHANICALS) ×5 IMPLANT
SET TUBE IRRIG SUCTION NO TIP (IRRIGATION / IRRIGATOR) ×3 IMPLANT
SHEET LAVH (DRAPES) ×3 IMPLANT
SOLUTION ELECTROLUBE (MISCELLANEOUS) ×3 IMPLANT
SPONGE LAP 18X18 X RAY DECT (DISPOSABLE) ×1 IMPLANT
STRIP CLOSURE SKIN 1/2X4 (GAUZE/BANDAGES/DRESSINGS) ×2 IMPLANT
SUT VIC AB 0 CT1 27 (SUTURE) ×3
SUT VIC AB 0 CT1 27XBRD ANTBC (SUTURE) ×6 IMPLANT
SUT VIC AB 4-0 PS2 27 (SUTURE) ×6 IMPLANT
SUT VICRYL 0 UR6 27IN ABS (SUTURE) ×3 IMPLANT
SYR 50ML LL SCALE MARK (SYRINGE) ×3 IMPLANT
SYR BULB IRRIGATION 50ML (SYRINGE) IMPLANT
TRAP SPECIMEN MUCOUS 40CC (MISCELLANEOUS) ×2 IMPLANT
TRAY FOLEY CATH 14FRSI W/METER (CATHETERS) ×3 IMPLANT
TROCAR XCEL 12X100 BLDLESS (ENDOMECHANICALS) ×3 IMPLANT
TROCAR XCEL BLADELESS 5X75MML (TROCAR) ×3 IMPLANT
TUBING FILTER THERMOFLATOR (ELECTROSURGICAL) ×2 IMPLANT
WATER STERILE IRR 1500ML POUR (IV SOLUTION) ×4 IMPLANT

## 2012-01-15 NOTE — H&P (View-Only) (Signed)
Consult Note: Gyn-Onc  Allison Rivera 51 y.o. female  CC:  Chief Complaint  Patient presents with  . Endometrial cancer    New consult    HPI: Patient is seen today in consultation at the request of Dr. Cousins.  Allison Rivera is a very pleasant 51-year-old gravida 1 para 1 who in February of this year turning the age of 51 she began noticing that her cycles are becoming more sporadic and somewhat irregular. She then began having some irregular spotting. She was seen by Dr. Bland and had a negative exam and negative Pap smear. About 3 months ago the spotting became more persistent and she was bleeding for 3 weeks. She was scheduled to see Dr. Cousins on but missed that appointment. She then saw Dr. Bland with these concerns and was rescheduled with Dr. Cousins. An ultrasound was performed that revealed 3 small uterine fibroids. The fibrous measured approximately 1 x 2 cm each. The uterine size overall was 9.7 x 7.4 x 5.6 cm. However, the endometrium was irregular with a 2.5 cm in thickness. Due to this, the patient underwent D&C on August 16. She also had a diagnostic hysteroscopy. At the time of the hysteroscopy there was diffuse polypoid lesion in the endometrial cavity with prominent irregular blood vessels noted. Pathology returned as a grade 1 endometrioid adenocarcinoma. It is for this reason that she comes in to see us today.  Interval History:  She has only had slight spotting since her D&C otherwise is without complaints.  Review of Systems: Review of systems is negative.  Current Meds:  Outpatient Encounter Prescriptions as of 12/20/2011  Medication Sig Dispense Refill  . acetaminophen (TYLENOL) 325 MG tablet Take 650 mg by mouth daily as needed. headache      . Aliskiren-Hydrochlorothiazide (TEKTURNA HCT) 300-25 MG TABS Take 1 tablet by mouth daily.      . ALPRAZolam (XANAX) 1 MG tablet Take 1 mg by mouth 3 (three) times daily.      . amLODipine (NORVASC) 5 MG tablet Take 5 mg  by mouth daily.      . Multiple Vitamin (MULTIVITAMIN WITH MINERALS) TABS Take 1 tablet by mouth daily.        Allergy: No Known Allergies  Social Hx:   History   Social History  . Marital Status: Divorced    Spouse Name: N/A    Number of Children: N/A  . Years of Education: N/A   Occupational History  . Not on file.   Social History Main Topics  . Smoking status: Never Smoker   . Smokeless tobacco: Not on file  . Alcohol Use: 0.6 oz/week    1 Glasses of wine per week     occasionally  . Drug Use: No  . Sexually Active: Yes   Other Topics Concern  . Not on file   Social History Narrative  . No narrative on file    Past Surgical Hx: Had a resection of an abdominal tumor when she was 10-11 years. She does not know that it was for. She then subsequently approximately 25 years ago had a plastic surgery repair of the incision secondary to a keloid. Past Surgical History  Procedure Date  . Breast surgery 2000    lumpectomy  . Breast biopsy 2003  . Colonoscopy 2013    Past Medical Hx:  Past Medical History  Diagnosis Date  . Hypertension   . Anxiety   . Cancer     breast      Family Hx: No family history on file. She has a sister with breast cancer at age of 48. She has 3 maternal aunts with breast cancer and 2 maternal first cousins with breast cancer. She is not aware that anyone had any genetic testing. She is up-to-date on her mammograms and 20 months ago that was negative. She had a colonoscopy in February of 2013 that was benign. She did have 2 benign polyps. It was recommended that she have followup in 5 years.  Vitals:  Blood pressure 118/60, pulse 90, temperature 98.2 F (36.8 C), temperature source Oral, resp. rate 22, height 5' 9" (1.753 m), weight 141 lb 8 oz (64.184 kg).  Physical Exam: Arched well-developed female in no acute distress.  Neck: Supple, no lymphadenopathy no thyromegaly.  Lungs: Clear to auscultation bilaterally.  Cardiovascular:  Regular rate and rhythm.  Abdomen: Well-healed transverse incision. Abdomen is soft nontender nondistended no palpable mass or prostatomegaly.  Extremities: No edema.  Pelvic: Normal external female genitalia. Vagina is slightly atrophic. The cervix is nulliparous. There are no visible lesions. Bimanual examination the cervix is palpably normal. The corpus is of normal size shape and consistency there is no adnexal masses.  Assessment/Plan:  51 year old with grade 1 endometrioid adenocarcinoma. Options for surgery including hysterectomy BSO and appropriate staging were discussed the patient. We discussed proceeding with laparotomy versus minimally invasive surgery. She will be most interested in minimally invasive surgery. The earliest surgical date that we have available in [place] is September 10. However, as she has met me she would like to defer surgery September 24 so that I will be here in [place].  We discussed the surgical procedure and at the uterus, the cervix, bilateral fallopian tubes and ovaries would be removed. While she is under anesthesia, the uterus will be sent for frozen section. If there's less than 50% myometrial invasion she will not require full lymphadenectomy. If there's greater than 50% myometrial invasion will perform a full lymphadenectomy at the time of her initial surgery. She understands that there is about a 5% risk of needing to convert to laparotomy. Risks and benefits of surgery including but not limited to bleeding, infection, injury to surrounding organs, and  thromboembolic disease were discussed with the patient. Her questions were elicited and answered to her satisfaction. She wishes to proceed. Nancy Wilkinson RN reviewed preoperative procedures with the patient. She has our cards and will call us if she has any questions. We appreciate the opportunity to partner in the care of this patient. Cylis Ayars A., MD 12/20/2011, 3:56 PM   Please refer to the  change in the history.  The patient was initially seen by Dr. Bland and referred to Dr. Cousins.  The patient did not keep the initial consult with Dr. Cousins and was re-referred to her office where appropriate biopsies were performed. 

## 2012-01-15 NOTE — Anesthesia Postprocedure Evaluation (Signed)
  Anesthesia Post-op Note  Patient: Allison Rivera  Procedure(s) Performed: Procedure(s) (LRB): ROBOTIC ASSISTED TOTAL HYSTERECTOMY WITH BILATERAL SALPINGO OOPHERECTOMY (N/A) LYMPH NODE DISSECTION ()  Patient Location: PACU  Anesthesia Type: General  Level of Consciousness: awake and alert   Airway and Oxygen Therapy: Patient Spontanous Breathing  Post-op Pain: mild  Post-op Assessment: Post-op Vital signs reviewed, Patient's Cardiovascular Status Stable, Respiratory Function Stable, Patent Airway and No signs of Nausea or vomiting  Post-op Vital Signs: stable  Complications: No apparent anesthesia complications

## 2012-01-15 NOTE — Interval H&P Note (Signed)
History and Physical Interval Note:  01/15/2012 7:07 AM  Allison Rivera  has presented today for surgery, with the diagnosis of endometrial cancer  The various methods of treatment have been discussed with the patient and family. After consideration of risks, benefits and other options for treatment, the patient has consented to  Procedure(s) (LRB) with comments: ROBOTIC ASSISTED TOTAL HYSTERECTOMY WITH BILATERAL SALPINGO OOPHERECTOMY (N/A) - Possible lymph nodes ( PLEASE REQUEST [facility] ON PATHOLOGY REQUISITION PER PHYSICIAN ) as a surgical intervention .  The patient's history has been reviewed, patient examined, no change in status, stable for surgery.  I have reviewed the patient's chart and labs.  Questions were answered to the patient's satisfaction.  We may speak with her family after the surgery is completed.   Monterius Rolf A.

## 2012-01-15 NOTE — Preoperative (Signed)
Beta Blockers   Reason not to administer Beta Blockers:Not Applicable 

## 2012-01-15 NOTE — Anesthesia Procedure Notes (Signed)
Procedure Name: Intubation Date/Time: 01/15/2012 7:27 AM Performed by: Leroy Libman L Patient Re-evaluated:Patient Re-evaluated prior to inductionOxygen Delivery Method: Circle system utilized Preoxygenation: Pre-oxygenation with 100% oxygen Intubation Type: IV induction Ventilation: Mask ventilation without difficulty and Oral airway inserted - appropriate to patient size Laryngoscope Size: Hyacinth Meeker and 2 Grade View: Grade I Tube type: Oral Tube size: 7.5 mm Number of attempts: 1 Airway Equipment and Method: Stylet Placement Confirmation: ETT inserted through vocal cords under direct vision,  breath sounds checked- equal and bilateral and positive ETCO2 Secured at: 23 cm Tube secured with: Tape Dental Injury: Teeth and Oropharynx as per pre-operative assessment

## 2012-01-15 NOTE — Op Note (Signed)
PATIENT: [name] DATE OF BIRTH: 12-Sep-1960 ENCOUNTER DATE: 01/15/2012   Preop Diagnosis: grade 1 endometrioid adenocarcinoma.   Postoperative Diagnosis: same.   Surgery: Total robotic hysterectomy bilateral salpingo-oophorectomy, bilateral pelvic lymph node dissection.   Surgeons:  Bernita Buffy. Duard Brady, MD; Antionette Char, MD   Assistant: Telford Nab   Anesthesia: General   Estimated blood loss: 25 ml   IVF: 1700 ml   Urine output:  150 ml   Complications: None   Pathology: Uterus, cervix, bilateral tubes and ovaries, bilateral pelvic lymph nodes to pathology.   Operative findings: Globular fibroid uterus. Frozen section revealed a grade 1 endometrial adenocarcinoma with minimal myometrial invasion and adenomyosis.   Procedure: The patient was identified in the preoperative holding area. Informed consent was signed on the chart. Patient was seen history was reviewed and exam was performed.   The patient was then taken to the operating room and placed in the supine position with SCD hose on. General anesthesia was then induced without difficulty. She was then placed in the dorsolithotomy position. Her arms were tucked at her side with appropriate precautions on the gel pad. Shoulder blocks were then placed in the usual fashion with appropriate precautions. A OG-tube was placed to suction. First timeout was performed to confirm the patient procedure antibiotic allergy status, estimated blood loss and OR time. The perineum was then prepped in the usual fashion with Betadine. A 14 French Foley was inserted into the bladder under sterile conditions. A sterile speculum was placed in the vagina. The cervix was without lesions. The cervix was grasped with a single-tooth tenaculum. The dilator without difficulty. A ZUMI with a large Koe ring was placed without difficulty. The abdomen was then prepped with 1 Chlor prep sponges per protocol.   Patient was then draped after the prep  was dried. Second timeout was performed to confirm the above. After again confirming OG tube placement and it was to suction. A stab-wound was made in left upper quadrant 2 cm below the costal margin on the left in the midclavicular line. A 5 mm operative report was used to assure intra-abdominal placement. The abdomen was insufflated. At this point all points during the procedure the patient's intra-abdominal pressure was not increased over 15 mm of mercury. After insufflation was complete, the patient was placed in deep Trendelenburg position. 25 cm above the pubic symphysis that area was marked the camera port. Bilateral robotic ports were marked 10 cm from the midline incision at approximately 5 angle. Under direct visualization each of the trochars was placed into the abdomen. The small bowel was folded on its mesentery to allow visualization to the pelvis. The 5 mm LUQ port was then converted to a 10/12 port under direct visualization.  After assuring adequate visualization, the robot was then docked in the usual fashion. Under direct visualization the robotic instruments replaced.   The round ligament on the patient's right side was transected with monopolar cautery. The anterior and posterior leaves of the broad ligament were then taken down in the usual fashion. The ureter was identified on the medial leaf of the broad ligament. A window was made between the IP and the ureter. The IP was coagulated with bipolar cautery and transected. The posterior leaf of the broad ligament was taken down to the level of the KOH ring. The bladder flap was created using meticulous dissection and pinpoint cautery. The uterine vessels were coagulated with bipolar cautery. The uterine vessels were then transected and the C loop  was created. The same procedure was performed on the patient's left side.   The pneumo-occulder in the vagina was then insufflated. The colpotomy was then created in the usual fashion. The specimen  was then delivered to the vagina and sent for frozen section. Our attention was then drawn to opening the paravesical space on her right side the perirectal space was also opened. The obturator nerve was identified. The nodal bundle extending over the external iliac artery down to the external iliac vein was taken down using sharp dissection and monopolar cautery. The genitofemoral nerve was identified and spared. We continued our dissection down to the level of the obturator nerve. The nodal bundle superior to the obturator nerve was taken. All pedicles were noted to be hemostatic the ureter was noted to be well medial of the area of dissection. The nodal bundle was then placed and an Endo catch bag. The same procedure was performed on the patient's left side.   All specimens were delivered to the vagina. At this point frozen section returned as minimal myometrial invasion of grade 1 lesion. The decision was made to stop the procedure is no further lymphadenectomy was indicated.   The vaginal cuff was closed with a running 0 Vicryl on CT 1 suture. The abdomen and pelvis were copiously irrigated and noted to be hemostatic. The robotic instruments were removed under direct visualization as were the robotic trochars. The pneumoperitoneum was removed. The patient was then taken out of the Trendelenburg position. Using of 0 Vicryl on a UR 6 needle the midline port port fascia and the fascia in the left upper quadrant port were reapproximated. The skin was closed using 4-0 Vicryl. Steri-Strips and benzoin were applied. The pneumo occluded balloon was removed from the vagina. The vagina was swabbed and noted to be hemostatic.   All instrument needle and Ray-Tec counts were correct x2. The patient tolerated the procedure well and was taken to the recovery room in stable condition. This is [name] dictating an operative note on patient Allison Rivera.

## 2012-01-15 NOTE — Transfer of Care (Signed)
Immediate Anesthesia Transfer of Care Note  Patient: Allison Rivera  Procedure(s) Performed: Procedure(s) (LRB) with comments: ROBOTIC ASSISTED TOTAL HYSTERECTOMY WITH BILATERAL SALPINGO OOPHERECTOMY (N/A) -  lymph nodes ( PLEASE REQUEST [facility] ON PATHOLOGY REQUISITION PER PHYSICIAN ) LYMPH NODE DISSECTION ()  Patient Location: PACU  Anesthesia Type: General  Level of Consciousness: oriented and sedated  Airway & Oxygen Therapy: Patient Spontanous Breathing and Patient connected to face mask oxygen  Post-op Assessment: Report given to PACU RN and Post -op Vital signs reviewed and stable  Post vital signs: Reviewed and stable  Complications: No apparent anesthesia complications

## 2012-01-16 ENCOUNTER — Encounter (HOSPITAL_COMMUNITY): Payer: Self-pay | Admitting: Gynecologic Oncology

## 2012-01-16 LAB — CBC
MCH: 28.2 pg (ref 26.0–34.0)
MCV: 85.6 fL (ref 78.0–100.0)
Platelets: 365 10*3/uL (ref 150–400)
RDW: 13.8 % (ref 11.5–15.5)

## 2012-01-16 LAB — BASIC METABOLIC PANEL
CO2: 30 mEq/L (ref 19–32)
Calcium: 9.3 mg/dL (ref 8.4–10.5)
Creatinine, Ser: 0.81 mg/dL (ref 0.50–1.10)
Glucose, Bld: 118 mg/dL — ABNORMAL HIGH (ref 70–99)

## 2012-01-16 MED ORDER — OXYCODONE-ACETAMINOPHEN 5-325 MG PO TABS: 1.0000 | ORAL_TABLET | ORAL | Status: DC | PRN

## 2012-01-16 MED ORDER — ENOXAPARIN SODIUM 40 MG/0.4ML ~~LOC~~ SOLN
40.0000 mg | Freq: Once | SUBCUTANEOUS | Status: AC
  Administered 2012-01-16: 40 mg via SUBCUTANEOUS
  Filled 2012-01-16: qty 0.4

## 2012-01-16 NOTE — Discharge Summary (Signed)
Physician Discharge Summary  Patient ID: Allison Rivera MRN: [id number] DOB/AGE: 1960-07-07 51 y.o.  Admit date: 01/15/2012 Discharge date: 01/16/2012  Admission Diagnoses: Endometrial adenocarcinoma  Discharge Diagnoses:  Principal Problem:  *Endometrial adenocarcinoma  Discharged Condition: stable  Hospital Course: On 01/15/2012, the patient underwent the following: Procedure(s): ROBOTIC ASSISTED TOTAL HYSTERECTOMY WITH BILATERAL SALPINGO OOPHERECTOMY LYMPH NODE DISSECTION.   The postoperative course was uneventful.  She was discharged to home on postoperative day 1 tolerating a regular diet.  Consults: None  Significant Diagnostic Studies: None  Treatments: surgery: See above  Discharge Exam: Blood pressure 136/84, pulse 64, temperature 97.4 F (36.3 C), temperature source Oral, resp. rate 16, height 5\' 9"  (1.753 m), weight 146 lb (66.225 kg), SpO2 100.00%. General appearance: alert, cooperative and no distress Resp: clear to auscultation bilaterally Cardio: regular rate and rhythm, S1, S2 normal, no murmur, click, rub or gallop GI: soft, non-tender; bowel sounds normal; no masses,  no organomegaly Extremities: extremities normal, atraumatic, no cyanosis or edema Incision/Wound: Dressings removed.  Lap sites to the abdomen with steri strips clean, dry, and intact.  Disposition: 01-Home or Self Care  Discharge Orders    Future Orders Please Complete By Expires   Diet - low sodium heart healthy      Increase activity slowly      Driving Restrictions      Comments:   No driving for 1-2 week.  Do not take narcotics and drive.   Lifting restrictions      Comments:   No lifting greater than 10 lbs.   Sexual Activity Restrictions      Comments:   No sexual activity, nothing in the vagina, for 8 weeks.   Call MD for:  temperature >100.4      Call MD for:  persistant nausea and vomiting      Call MD for:  severe uncontrolled pain      Call MD for:  redness, tenderness,  or signs of infection (pain, swelling, redness, odor or green/yellow discharge around incision site)      Call MD for:  difficulty breathing, headache or visual disturbances      Call MD for:  hives      Call MD for:  persistant dizziness or light-headedness      Call MD for:  extreme fatigue          Medication List     As of 01/16/2012  8:48 AM    TAKE these medications         ALPRAZolam 1 MG tablet   Commonly known as: XANAX   Take 1 mg by mouth 3 (three) times daily with meals as needed.      amLODipine 5 MG tablet   Commonly known as: NORVASC   Take 5 mg by mouth at bedtime.      multivitamin with minerals Tabs   Take 1 tablet by mouth daily.      oxyCODONE-acetaminophen 5-325 MG per tablet   Commonly known as: PERCOCET/ROXICET   Take 1-2 tablets by mouth every 4 (four) hours as needed (moderate to severe pain (when tolerating fluids)).      potassium chloride SA 20 MEQ tablet   Commonly known as: K-DUR,KLOR-CON   Take 20 mEq by mouth daily. For 2 days only   Start date:01/14/12      TEKTURNA HCT 300-25 MG Tabs   Generic drug: Aliskiren-Hydrochlorothiazide   Take 1 tablet by mouth at bedtime.      TYLENOL 325 MG  tablet   Generic drug: acetaminophen   Take 650 mg by mouth daily as needed. headache        Signed: CROSS, MELISSA DEAL 01/16/2012, 8:48 AM

## 2012-01-17 ENCOUNTER — Telehealth: Payer: Self-pay | Admitting: *Deleted

## 2012-01-17 NOTE — Telephone Encounter (Signed)
Patient notified of Path results.   

## 2012-02-11 ENCOUNTER — Other Ambulatory Visit: Payer: Self-pay | Admitting: Gynecologic Oncology

## 2012-02-11 DIAGNOSIS — C541 Malignant neoplasm of endometrium: Secondary | ICD-10-CM

## 2012-02-11 MED ORDER — VENLAFAXINE HCL ER 37.5 MG PO CP24: 37.5000 mg | ORAL_CAPSULE | Freq: Every day | ORAL | Status: DC

## 2012-02-11 MED ORDER — IBUPROFEN 800 MG PO TABS: 800.0000 mg | ORAL_TABLET | Freq: Three times a day (TID) | ORAL | Status: DC | PRN

## 2012-02-11 NOTE — Progress Notes (Signed)
Patient had called this morning with complaints of frequent hot flashes.  Reporting having hot flashes since surgery with an increase in severity and frequency at this time.  Reporting having a hot flash every three hours with insomnia.  Patient requesting pharmacological treatment for hot flashes.  Reporting light vaginal spotting.  Informed that light vaginal spotting can be a normal finding up to six weeks after surgery but to call if there is an increase in the amount, development of bright red bleeding, or if the spotting does not resolve.  Dr. Duard Brady notified of the patient's complaints about hot flashes.  She recommended beginning Effexor XL 37.5 mg PO daily since the patient had a history of breast cancer.  Called and informed the patient of Dr. Denman George recommendations.  Patient informed of side effects including increased risk of suicidal thinking and behavior or changes in mood and instructed on reportable signs and symptoms.  Effexor 37.5 XL mg PO daily along with ibuprofen 800 mg PO Q8H as needed for pain sent to [facility] electronically.  Instructed to notify the office for any questions or concerns.  Pt verbalizing understanding.

## 2012-03-12 ENCOUNTER — Ambulatory Visit: Payer: BC Managed Care – PPO | Attending: Gynecologic Oncology | Admitting: Gynecologic Oncology

## 2012-03-12 ENCOUNTER — Encounter: Payer: Self-pay | Admitting: Gynecologic Oncology

## 2012-03-12 VITALS — BP 110/68 | HR 68 | Temp 97.6°F | Resp 16 | Ht 69.0 in | Wt 131.0 lb

## 2012-03-12 DIAGNOSIS — C541 Malignant neoplasm of endometrium: Secondary | ICD-10-CM

## 2012-03-12 DIAGNOSIS — C549 Malignant neoplasm of corpus uteri, unspecified: Secondary | ICD-10-CM | POA: Insufficient documentation

## 2012-03-12 MED ORDER — VENLAFAXINE HCL ER 75 MG PO CP24: 75.0000 mg | ORAL_CAPSULE | Freq: Every day | ORAL | Status: DC

## 2012-03-12 NOTE — Progress Notes (Signed)
Follow Up Note: Gyn-Onc  Allison Rivera 51 y.o. female  CC:  Chief Complaint  Patient presents with  . Endo ca    Follow up post-op    HPI:  Allison Rivera is a 51 year old female, gravida 1 para 1, referred by Dr. Cherly Hensen.  Beginning February of 2013, she noticed that her menstrual cycles were becoming irregular and sporadic, with episodes of irregular vaginal spotting.  At that time, she was examined by Dr. Parke Simmers, resulting a negative exam and negative Pap smear.  In June 2013, the vaginal spotting increased in frequency and she was bleeding for 3 weeks.  An appointment with Dr. Cherly Hensen was arranged but the patient missed the appointment.  She then saw Dr. Parke Simmers with these concerns and was rescheduled with Dr. Cherly Hensen.  An ultrasound revealed 3 small uterine fibroids, each measuring approximately 1 x 2 cm. The uterus was measured as 9.7 x 7.4 x 5.6 cm with the endometrium irregular with a 2.5 cm in thickness.  Based on the findings, the patient underwent a D&C and diagnostic hysteroscopy on August 16.  The hysteroscopy revealed a diffuse polypoid lesion in the endometrial cavity with prominent irregular blood vessels.  The pathology revealed a grade 1 endometrioid adenocarcinoma, which prompted the referral to Gynecologic Oncology.  On January 15, 2012, she underwent a total robotic hysterectomy, bilateral salpingo-oophorectomy, and bilateral lymph node dissection.  Final pathology returned as invasive grade I endometrial adenocarcinoma with myometrial invasion 0.6 cm where the myometrium is 2.5cm in thickness.  8 total lymph nodes examined were negative with no definitive lymph/vascular invasion identified.  Microsatellite instability by PCR revealed microsatellite stable.  The post-operative course was uneventful.  Interval History:  Patient presents today for post-operative follow up.  She reports a slight decrease in menopausal symptoms but not resolution since starting Effexor XL  37.5 mg daily.  Reports lower abdominal soreness since surgery with relief from ibuprofen and heating pad use.  She reports that she is unable to sleep with two to three hot flashes a night.  She states that she feels fatigued and that she has "very little energy."  Denies episodes of nausea or vomiting, vaginal bleeding/ discharge, or rectal bleeding.  Mammogram and colonoscopy up to date.  Review of Systems  Constitutional: Feels tired with no energy during the day.  Reporting a decrease in frequency of hot flashes since starting Effexor XR.  Denies fever. Skin: No rash, sores, jaundice, itching, or dryness.  Cardiovascular: No chest pain, shortness of breath, or edema  Pulmonary: No cough or wheeze.  Gastro Intestinal: Reporting intermittent lower abdominal soreness.  No nausea, vomiting, constipation, or diarrhea reported. No bright red blood per rectum or change in bowel movement.  Genitourinary: No frequency, urgency, or dysuria.  Denies vaginal bleeding and discharge.  Musculoskeletal: No myalgia, arthralgia, joint swelling or pain.  Neurologic: No weakness, numbness, or change in gait.  Psychology: Reporting "feeling depressed" due to decreased activity and absence from work.  Insomnia reported with two to three hot flashes a night.   Current Meds:  Outpatient Encounter Prescriptions as of 03/12/2012  Medication Sig Dispense Refill  . Aliskiren-Hydrochlorothiazide (TEKTURNA HCT) 300-25 MG TABS Take 1 tablet by mouth at bedtime.       . ALPRAZolam (XANAX) 1 MG tablet Take 1 mg by mouth 3 (three) times daily with meals as needed.       Marland Kitchen amLODipine (NORVASC) 5 MG tablet Take 5 mg by mouth at bedtime.       Marland Kitchen  ibuprofen (ADVIL,MOTRIN) 800 MG tablet Take 1 tablet (800 mg total) by mouth every 8 (eight) hours as needed for pain.  60 tablet  1  . Multiple Vitamin (MULTIVITAMIN WITH MINERALS) TABS Take 1 tablet by mouth daily.      Marland Kitchen venlafaxine XR (EFFEXOR-XR) 37.5 MG 24 hr capsule Take 1  capsule (37.5 mg total) by mouth daily.  30 capsule  3  . acetaminophen (TYLENOL) 325 MG tablet Take 650 mg by mouth daily as needed. headache      . oxyCODONE-acetaminophen (PERCOCET/ROXICET) 5-325 MG per tablet Take 1-2 tablets by mouth every 4 (four) hours as needed (moderate to severe pain (when tolerating fluids)).  60 tablet  0  . potassium chloride SA (K-DUR,KLOR-CON) 20 MEQ tablet Take 20 mEq by mouth daily. For 2 days only   Start date:01/14/12       Allergy: No Known Allergies  Social Hx:   History   Social History  . Marital Status: Divorced    Spouse Name: N/A    Number of Children: N/A  . Years of Education: N/A   Occupational History  . Not on file.   Social History Main Topics  . Smoking status: Never Smoker   . Smokeless tobacco: Former Neurosurgeon    Quit date: 01/11/1984  . Alcohol Use: 0.6 oz/week    1 Glasses of wine per week     Comment: occasionally  . Drug Use: No  . Sexually Active: Yes   Other Topics Concern  . Not on file   Social History Narrative  . No narrative on file    Past Surgical Hx:  Past Surgical History  Procedure Date  . Breast surgery 2000    lumpectomy  . Breast biopsy 2003  . Colonoscopy 2013  . Dilation and curettage of uterus 12/07/2011    pre-cancerous cells  . Lymph node dissection 01/15/2012    Procedure: LYMPH NODE DISSECTION;  Surgeon: Rejeana Brock A. Duard Brady, MD;  Location: [facility];  Service: Gynecology;;  . Abdominal hysterectomy   . Robotic assisted total hysterectomy with bilateral salpingo oopherectomy 01/15/12    bilateral pelvic lymph node dissection    Past Medical Hx:  Past Medical History  Diagnosis Date  . Hypertension   . Anxiety   . Cancer     breast    Family Hx:  Family History  Problem Relation Age of Onset  . Breast cancer Sister   . Breast cancer Maternal Aunt   . Breast cancer Cousin   . Breast cancer Cousin   . Breast cancer Maternal Aunt   . Breast cancer Maternal Aunt     Vitals:  Blood  pressure 110/68, pulse 68, temperature 97.6 F (36.4 C), temperature source Oral, resp. rate 16, height 5\' 9"  (1.753 m), weight 131 lb (59.421 kg), last menstrual period 12/11/2011.  Physical Exam:  General:  Well-developed, well nourished female in no acute distress.  Appears lethargic with eyes erythematous due to insomnia.  Neck: Supple, no lymphadenopathy no thyromegaly.  Lungs: Clear to auscultation bilaterally.  Cardiovascular: Regular rate and rhythm.  Abdomen: Well-healed lap sites to the abdomen.  No herniations palpated.  Abdomen soft and mildly tender with light palpation.  Non-distended with no palpable masses. Extremities: Negative for edema or cyanosis.  Pelvic: Normal external female genitalia. Vagina mildly atrophic with the vaginal cuff intact.  No visible lesions, vaginal bleeding, or discharge noted.  Bimanual examination revealed no masses.   Assessment/Plan:  Ms. Heathcock is a 51 year old  with grade 1, stage 1A endometrioid adenocarcinoma.  She underwent a total robotic hysterectomy, bilateral salpingo-oophorectomy, and bilateral lymph node dissection on January 15, 2012.  Final pathology returned as invasive grade I endometrial adenocarcinoma with myometrial invasion 0.6 cm where the myometrium is 2.5cm in thickness.  Post operative examination unremarkable.  Requesting to return to work on March 23, 2012.  Reportable signs and symptoms reviewed.  Survivorship packet discussed with the patient with recommendations for preventative care and support services provided at the [facility].  She is to begin taking Effexor XR 75 mg daily and stop taking the previous prescription for Effexor XR 37.5 mg to assist with symptom management with potential side effects discussed.  Recommended to continue the use of Tylenol or Ibuprofen for pain as needed along with heating pad use as tolerated.  Advised to contact the office for any issues or concerns.  Plan discussed with Dr. Duard Brady.  She  is to return for follow up at Gynecologic Oncology in six months.  At that time, if her exam is unremarkable, she will begin alternating visits with Dr. Cherly Hensen and Gynecologic Oncology.    CROSS, MELISSA DEAL, NP 03/12/2012, 12:17 PM

## 2012-03-12 NOTE — Patient Instructions (Addendum)
Begin taking Effexor XR 75 mg daily and stop taking the previous prescription for Effexor XR 37.5 mg to assist with symptom management.  Please review the information below about your Effexor XR prescription.  Continue the use of Tylenol or Ibuprofen for pain as needed along with heating pad use as tolerated.  Please contact the office for any issues or concerns.  Plan to return for follow up at Gynecologic Oncology in six months.  At that time, if your exam is unremarkable, you will begin alternating visits with Dr. Cherly Hensen and Gynecologic Oncology.    Thank you for coming to see me today.  I appreciate your confidence in choosing [facility] for your medical care.  If you have any questions about your visit today, please call our office and we will get back to you as soon as possible.  Warner Mccreedy, NP Gynecologic Oncology   Venlafaxine extended-release capsules What is this medicine? VENLAFAXINE (VEN la fax een) is used to treat depression, anxiety and panic disorder. This medicine may be used for other purposes; ask your health care provider or pharmacist if you have questions. What should I tell my health care provider before I take this medicine? They need to know if you have any of these conditions: -anorexia or weight loss -glaucoma -high blood pressure, heart problems or a recent heart attack -high cholesterol levels or receiving treatment for high cholesterol -kidney or liver disease -mania or bipolar disorder -seizures (convulsions) -suicidal thoughts or a previous suicide attempt -thyroid problems -an unusual or allergic reaction to venlafaxine, other medicines, foods, dyes, or preservatives -pregnant or trying to get pregnant -breast-feeding How should I use this medicine? Take this medicine by mouth with a full glass of water. Follow the directions on the prescription label. Do not cut, crush or chew this medicine. Take it with food. Try to take your  medicine at about the same time each day. Do not take your medicine more often than directed. Do not stop taking this medicine suddenly except upon the advice of your doctor. Stopping this medicine too quickly may cause serious side effects or your condition may worsen. A special MedGuide will be given to you by the pharmacist with each prescription and refill. Be sure to read this information carefully each time. Talk to your pediatrician regarding the use of this medicine in children. Special care may be needed. Overdosage: If you think you have taken too much of this medicine contact a poison control center or emergency room at once. NOTE: This medicine is only for you. Do not share this medicine with others. What if I miss a dose? If you miss a dose, take it as soon as you can. If it is almost time for your next dose, take only that dose. Do not take double or extra doses. What may interact with this medicine? Do not take this medicine with any of the following medications: -desvenlafaxine -duloxetine -linezolid -MAOIs like Azilect, Carbex, Eldepryl, Marplan, Nardil, and Parnate -medicines for weight control or appetite -methylene blue (injected into a vein) -nefazodone -procarbazine -tryptophan This medicine may also interact with the following medications: -amphetamine or dextroamphetamine -aspirin and aspirin-like medicines -cimetidine -clozapine -medicines for heart rhythm or blood pressure -medicines for migraine headache like almotriptan, eletriptan, frovatriptan, naratriptan, rizatriptan, sumatriptan, zolmitriptan -medicines that treat or prevent blood clots like warfarin, enoxaparin, and dalteparin -NSAIDS, medicines for pain and inflammation, like ibuprofen or naproxen -other medicines for depression, anxiety, or psychotic disturbances -ritonavir -St. John's wort -  tramadol This list may not describe all possible interactions. Give your health care provider a list of all  the medicines, herbs, non-prescription drugs, or dietary supplements you use. Also tell them if you smoke, drink alcohol, or use illegal drugs. Some items may interact with your medicine. What should I watch for while using this medicine? Tell your doctor if your symptoms do not get better or if they get worse. Visit your doctor or health care professional for regular checks on your progress. Because it may take several weeks to see the full effects of this medicine, it is important to continue your treatment as prescribed by your doctor. Patients and their families should watch out for new or worsening thoughts of suicide or depression. Also watch out for sudden changes in feelings such as feeling anxious, agitated, panicky, irritable, hostile, aggressive, impulsive, severely restless, overly excited and hyperactive, or not being able to sleep. If this happens, especially at the beginning of treatment or after a change in dose, call your health care professional. This medicine can cause an increase in blood pressure. Check with your doctor for instructions on monitoring your blood pressure while taking this medicine. You may get drowsy or dizzy. Do not drive, use machinery, or do anything that needs mental alertness until you know how this medicine affects you. Do not stand or sit up quickly, especially if you are an older patient. This reduces the risk of dizzy or fainting spells. Alcohol may interfere with the effect of this medicine. Avoid alcoholic drinks. Your mouth may get dry. Chewing sugarless gum, sucking hard candy and drinking plenty of water will help. Contact your doctor if the problem does not go away or is severe. What side effects may I notice from receiving this medicine? Side effects that you should report to your doctor or health care professional as soon as possible: -allergic reactions like skin rash, itching or hives, swelling of the face, lips, or tongue -breathing  problems -changes in vision -hallucination, loss of contact with reality -seizures -suicidal thoughts or other mood changes -trouble passing urine or change in the amount of urine -unusual bleeding or bruising Side effects that usually do not require medical attention (report to your doctor or health care professional if they continue or are bothersome): -change in sex drive or performance -constipation -increased sweating -loss of appetite -nausea -tremors -weight loss This list may not describe all possible side effects. Call your doctor for medical advice about side effects. You may report side effects to FDA at 1-800-FDA-1088. Where should I keep my medicine? Keep out of the reach of children. Store at a controlled temperature between 20 and 25 degrees C (68 degrees and 77 degrees F), in a dry place. Throw away any unused medicine after the expiration date. NOTE: This sheet is a summary. It may not cover all possible information. If you have questions about this medicine, talk to your doctor, pharmacist, or health care provider.  2013, [employer]. (08/24/2011 9:05:04 PM)

## 2012-03-13 ENCOUNTER — Encounter: Payer: Self-pay | Admitting: Gynecologic Oncology

## 2012-03-18 ENCOUNTER — Telehealth: Payer: Self-pay | Admitting: Gynecologic Oncology

## 2012-03-18 NOTE — Telephone Encounter (Signed)
Called to speak with patient about current status with taking an increased dose of Effexor of menopausal symptoms.  Reporting that the 75 mg of effexor daily is working better for her.  No complaints voiced.  Instructed to call for any questions or concerns.

## 2012-05-05 ENCOUNTER — Other Ambulatory Visit: Payer: Self-pay | Admitting: Gynecologic Oncology

## 2012-05-05 DIAGNOSIS — C541 Malignant neoplasm of endometrium: Secondary | ICD-10-CM

## 2012-05-05 MED ORDER — IBUPROFEN 800 MG PO TABS: 800.0000 mg | ORAL_TABLET | Freq: Three times a day (TID) | ORAL | Status: AC | PRN

## 2012-05-05 NOTE — Progress Notes (Signed)
Patient called requesting a refill on ibuprofen 800 mg tablets that she takes once daily as needed for abdominal soreness/ cramping.  Reports improvement in hot flashes since increasing effexor dose.  No other complaints voiced.  Instructed to call for any questions or concerns.

## 2012-08-28 ENCOUNTER — Ambulatory Visit: Payer: BC Managed Care – PPO | Attending: Gynecologic Oncology | Admitting: Gynecologic Oncology

## 2012-08-28 ENCOUNTER — Encounter: Payer: Self-pay | Admitting: Gynecologic Oncology

## 2012-08-28 VITALS — BP 150/90 | HR 92 | Temp 98.5°F | Resp 20 | Ht 69.0 in | Wt 148.0 lb

## 2012-08-28 DIAGNOSIS — C549 Malignant neoplasm of corpus uteri, unspecified: Secondary | ICD-10-CM | POA: Insufficient documentation

## 2012-08-28 DIAGNOSIS — N838 Other noninflammatory disorders of ovary, fallopian tube and broad ligament: Secondary | ICD-10-CM | POA: Insufficient documentation

## 2012-08-28 DIAGNOSIS — N72 Inflammatory disease of cervix uteri: Secondary | ICD-10-CM | POA: Insufficient documentation

## 2012-08-28 DIAGNOSIS — C541 Malignant neoplasm of endometrium: Secondary | ICD-10-CM

## 2012-08-28 DIAGNOSIS — D259 Leiomyoma of uterus, unspecified: Secondary | ICD-10-CM | POA: Insufficient documentation

## 2012-08-28 NOTE — Progress Notes (Signed)
Follow Up Note: Gyn-Onc  [name] 52 y.o. female  CC:  Chief Complaint  Patient presents with  . Endometrial Cancer    Follow up    HPI:  Allison Rivera is a 52 year old female, gravida 1 para 1, referred by Dr. Cherly Hensen.  Beginning February of 2013, she noticed that her menstrual cycles were becoming irregular and sporadic, with episodes of irregular vaginal spotting.  At that time, she was examined by Dr. Parke Simmers, resulting a negative exam and negative Pap smear.  In June 2013, the vaginal spotting increased in frequency and she was bleeding for 3 weeks.  An appointment with Dr. Cherly Hensen was arranged but the patient missed the appointment.  She then saw Dr. Parke Simmers with these concerns and was rescheduled with Dr. Cherly Hensen.  An ultrasound revealed 3 small uterine fibroids, each measuring approximately 1 x 2 cm. The uterus was measured as 9.7 x 7.4 x 5.6 cm with the endometrium irregular with a 2.5 cm in thickness.  Based on the findings, the patient underwent a D&C and diagnostic hysteroscopy on August 16.  The hysteroscopy revealed a diffuse polypoid lesion in the endometrial cavity with prominent irregular blood vessels.  The pathology revealed a grade 1 endometrioid adenocarcinoma, which prompted the referral to Gynecologic Oncology.  On January 15, 2012, she underwent a total robotic hysterectomy, bilateral salpingo-oophorectomy, and bilateral lymph node dissection.  Final pathology returned as invasive grade I endometrial adenocarcinoma with myometrial invasion 0.6 cm where the myometrium is 2.5cm in thickness.  8 total lymph nodes examined were negative with no definitive lymph/vascular invasion identified.  Microsatellite instability by PCR revealed microsatellite stable.  The post-operative course was uneventful.  Diagnosis 1. Uterus +/- tubes/ovaries, neoplastic, bilateral tubes - INVASIVE GRADE I ENDOMETRIAL ADENOCARCINOMA, ENDOMETRIOID TYPE. - EXOPHYTIC TUMOR MASS MEASURES 5  CM IN GREATEST DIMENSION AND INVADES INTO THE INNER HALF OF THE MYOMETRIUM. - DEFINITIVE LYMPH/VASCULAR INVASION IS NOT IDENTIFIED. - SEE ONCOLOGY TEMPLATE. OTHER ADDITIONAL BENIGN FINDINGS: - MYOMETRIUM WITH BENIGN LEIOMYOMA AND ADENOMYOSIS. - CERVIX SHOWS CHRONIC CERVICITIS. 1 of 4 FINAL for [name], [name] [phone]) Diagnosis(continued) - BILATERAL FALLOPIAN TUBES WITH BENIGN PARATUBAL CYSTS. - BENIGN UNREMARKABLE OVARIES. 2. Lymph nodes, regional resection, right pelvic - THREE BENIGN LYMPH NODES WITH NO TUMOR SEEN (0/3). 3. Lymph nodes, regional resection, left pelvic - FIVE BENIGN LYMPH NODES WITH NO TUMOR SEEN (0/5).  Interval History:  Patient presents today for her first surveillance visit.    Mammogram and colonoscopy up to date. She saw Dr. Parke Simmers her primary physician recently and all of her blood work was negative  Review of Systems  Constitutional: Feels tired with no energy during the day.  Reporting a decrease in frequency of hot flashes since starting Effexor XR.  She has been only during the day primarily at work when she is stressed. Denies fever. Skin: No rash, sores, jaundice, itching, or dryness.  Cardiovascular: No chest pain, shortness of breath, or edema  Pulmonary: No cough or wheeze.  Gastro Intestinal: Reporting intermittent lower abdominal soreness.  No nausea, vomiting, constipation, or diarrhea reported. No bright red blood per rectum or change in bowel movement.  Genitourinary: No frequency, urgency, or dysuria.  She has some occasional postcoital spotting. No dyspareunia on the outside but some deep dyspareunia. She is a thin white discharge with odor. This has been intermittent since her surgery. Musculoskeletal: No myalgia, arthralgia, joint swelling or pain.  Neurologic: No weakness, numbness, or change in gait.  Psychology: Reporting feeling much less  depressed.  Current Meds:  Outpatient Encounter Prescriptions as of 08/28/2012  Medication Sig  Dispense Refill  . Aliskiren-Hydrochlorothiazide (TEKTURNA HCT) 300-25 MG TABS Take 1 tablet by mouth at bedtime.       . ALPRAZolam (XANAX) 1 MG tablet Take 1 mg by mouth 3 (three) times daily with meals as needed.       Marland Kitchen amLODipine (NORVASC) 5 MG tablet Take 5 mg by mouth at bedtime.       . Multiple Vitamin (MULTIVITAMIN WITH MINERALS) TABS Take 1 tablet by mouth daily.      Marland Kitchen venlafaxine XR (EFFEXOR-XR) 75 MG 24 hr capsule Take 1 capsule (75 mg total) by mouth daily.  30 capsule  3  . acetaminophen (TYLENOL) 325 MG tablet Take 650 mg by mouth daily as needed. headache      . ibuprofen (ADVIL,MOTRIN) 800 MG tablet Take 1 tablet (800 mg total) by mouth every 8 (eight) hours as needed for pain.  60 tablet  2  . oxyCODONE-acetaminophen (PERCOCET/ROXICET) 5-325 MG per tablet Take 1-2 tablets by mouth every 4 (four) hours as needed (moderate to severe pain (when tolerating fluids)).  60 tablet  0  . potassium chloride SA (K-DUR,KLOR-CON) 20 MEQ tablet Take 20 mEq by mouth daily. For 2 days only   Start date:01/14/12       No facility-administered encounter medications on file as of 08/28/2012.   Allergy: No Known Allergies  Social Hx:   History   Social History  . Marital Status: Divorced    Spouse Name: N/A    Number of Children: N/A  . Years of Education: N/A   Occupational History  . Not on file.   Social History Main Topics  . Smoking status: Never Smoker   . Smokeless tobacco: Former Neurosurgeon    Quit date: 01/11/1984  . Alcohol Use: 0.6 oz/week    1 Glasses of wine per week     Comment: occasionally  . Drug Use: No  . Sexually Active: Yes   Other Topics Concern  . Not on file   Social History Narrative  . No narrative on file    Past Surgical Hx:  Past Surgical History  Procedure Laterality Date  . Breast surgery  2000    lumpectomy  . Breast biopsy  2003  . Colonoscopy  2013  . Dilation and curettage of uterus  12/07/2011    pre-cancerous cells  . Lymph node  dissection  01/15/2012    Procedure: LYMPH NODE DISSECTION;  Surgeon: Rejeana Brock A. Duard Brady, MD;  Location: [facility];  Service: Gynecology;;  . Abdominal hysterectomy    . Robotic assisted total hysterectomy with bilateral salpingo oopherectomy  01/15/12    bilateral pelvic lymph node dissection    Past Medical Hx:  Past Medical History  Diagnosis Date  . Hypertension   . Anxiety   . Cancer     breast    Family Hx:  Family History  Problem Relation Age of Onset  . Breast cancer Sister   . Breast cancer Maternal Aunt   . Breast cancer Cousin   . Breast cancer Cousin   . Breast cancer Maternal Aunt   . Breast cancer Maternal Aunt     Vitals:  Blood pressure 150/90, pulse 92, temperature 98.5 F (36.9 C), temperature source Oral, resp. rate 20, height 5\' 9"  (1.753 m), weight 148 lb (67.132 kg), last menstrual period 12/11/2011.  Physical Exam:  General:  Well-developed, well nourished female in no acute distress.  Neck: Supple, no lymphadenopathy no thyromegaly.   Lungs: Clear to auscultation bilaterally.   Cardiovascular: Regular rate and rhythm.   Abdomen: Well-healed lap sites to the abdomen.  No herniations palpated.  Abdomen soft and mildly tender with light palpation.  Non-distended with no palpable masses.  Extremities: Negative for edema or cyanosis.   Pelvic: Normal external female genitalia. Vagina mildly atrophic with the vaginal cuff intact.  Thin white discharge noted.  Small approximately 3 mm area of granulation tissue on the low left side of the vaginal cuff. After obtaining the patient's verbal consent, silver nitrate was applied without difficulty. She tolerated it well. Bimanual examination revealed no masses. Rectal exam confirms.  Assessment/Plan:  Ms. Pridgen is a 52 year old with grade 1, stage 1A endometrioid adenocarcinoma.  She underwent a total robotic hysterectomy, bilateral salpingo-oophorectomy, and bilateral lymph node dissection on January 15, 2012.  Final pathology returned as invasive grade I endometrial adenocarcinoma with myometrial invasion 0.6 cm where the myometrium is 2.5cm in thickness.    We discussed the small area of granulation tissue. She will notify us if she has any further episodes of bleeding. Otherwise she will see Dr. cousins in 6 months and return to see [place] in 1 year. Breylan Lefevers A., NP 08/28/2012, 11:33 AM

## 2012-08-28 NOTE — Patient Instructions (Signed)
Return to see Dr. cousins in 6 months and return to see GYN oncology in one year.

## 2012-11-05 ENCOUNTER — Ambulatory Visit: Payer: Self-pay | Admitting: Family Medicine

## 2012-11-05 VITALS — BP 148/84 | HR 72 | Temp 98.1°F | Resp 16 | Ht 68.0 in | Wt 145.0 lb

## 2012-11-05 DIAGNOSIS — Z0189 Encounter for other specified special examinations: Secondary | ICD-10-CM

## 2012-11-05 DIAGNOSIS — Z0283 Encounter for blood-alcohol and blood-drug test: Secondary | ICD-10-CM

## 2012-11-05 NOTE — Patient Instructions (Addendum)
We will be in touch with your results as soon as possible.  Take care!

## 2012-11-05 NOTE — Progress Notes (Signed)
[facility] [address] [place] [address] [phone]  Date:  11/05/2012   Name:  Allison Rivera   DOB:  [birth year]   MRN:  [id number]  PCP:  Geraldo Pitter, MD    Chief Complaint: Drug / Alcohol Assessment   History of Present Illness:  Allison Rivera is a 52 y.o. very pleasant female patient who presents with the following:  She needs a personal drug test today for a personal matter.  She states that she was falsely accused of using MJ and/ or cocaine, and wants proof that she does not. She is otherwise well today (recent treatment for endomtrial cancer) and has no other concerns  Patient Active Problem List   Diagnosis Date Noted  . Breast CA 12/20/2011  . Endometrial adenocarcinoma 12/20/2011    Past Medical History  Diagnosis Date  . Hypertension   . Anxiety   . Cancer     breast    Past Surgical History  Procedure Laterality Date  . Breast surgery  2000    lumpectomy  . Breast biopsy  2003  . Colonoscopy  2013  . Dilation and curettage of uterus  12/07/2011    pre-cancerous cells  . Lymph node dissection  01/15/2012    Procedure: LYMPH NODE DISSECTION;  Surgeon: Rejeana Brock A. Duard Brady, MD;  Location: [facility];  Service: Gynecology;;  . Abdominal hysterectomy    . Robotic assisted total hysterectomy with bilateral salpingo oopherectomy  01/15/12    bilateral pelvic lymph node dissection    History  Substance Use Topics  . Smoking status: Never Smoker   . Smokeless tobacco: Former Neurosurgeon    Quit date: 01/11/1984  . Alcohol Use: 0.6 oz/week    1 Glasses of wine per week     Comment: occasionally    Family History  Problem Relation Age of Onset  . Breast cancer Sister   . Breast cancer Maternal Aunt   . Breast cancer Cousin   . Breast cancer Cousin   . Breast cancer Maternal Aunt   . Breast cancer Maternal Aunt     No Known Allergies  Medication list has been reviewed and updated.  Current Outpatient Prescriptions on File  Prior to Visit  Medication Sig Dispense Refill  . acetaminophen (TYLENOL) 325 MG tablet Take 650 mg by mouth daily as needed. headache      . ALPRAZolam (XANAX) 1 MG tablet Take 1 mg by mouth 3 (three) times daily with meals as needed.       Marland Kitchen amLODipine (NORVASC) 5 MG tablet Take 5 mg by mouth at bedtime.       Marland Kitchen ibuprofen (ADVIL,MOTRIN) 800 MG tablet Take 1 tablet (800 mg total) by mouth every 8 (eight) hours as needed for pain.  60 tablet  2  . Aliskiren-Hydrochlorothiazide (TEKTURNA HCT) 300-25 MG TABS Take 1 tablet by mouth at bedtime.       . Multiple Vitamin (MULTIVITAMIN WITH MINERALS) TABS Take 1 tablet by mouth daily.      Marland Kitchen oxyCODONE-acetaminophen (PERCOCET/ROXICET) 5-325 MG per tablet Take 1-2 tablets by mouth every 4 (four) hours as needed (moderate to severe pain (when tolerating fluids)).  60 tablet  0  . potassium chloride SA (K-DUR,KLOR-CON) 20 MEQ tablet Take 20 mEq by mouth daily. For 2 days only   Start date:01/14/12      . venlafaxine XR (EFFEXOR-XR) 75 MG 24 hr capsule Take 1 capsule (75 mg total) by mouth daily.  30  capsule  3   No current facility-administered medications on file prior to visit.    Review of Systems:  As per HPI- otherwise negative.   Physical Examination: Filed Vitals:   11/05/12 1128  BP: 148/84  Pulse: 72  Temp: 98.1 F (36.7 C)  Resp: 16   Filed Vitals:   11/05/12 1128  Height: 5\' 8"  (1.727 m)  Weight: 145 lb (65.772 kg)   Body mass index is 22.05 kg/(m^2). Ideal Body Weight: Weight in (lb) to have BMI = 25: 164.1  GEN: WDWN, NAD, Non-toxic, A & O x 3 HEENT: Atraumatic, Normocephalic. Neck supple. No masses, No LAD. Ears and Nose: No external deformity. CV: RRR, No M/G/R. No JVD. No thrill. No extra heart sounds. PULM: CTA B, no wheezes, crackles, rhonchi. No retractions. No resp. distress. No accessory muscle use.Marland Kitchen EXTR: No c/c/e NEURO Normal gait.  PSYCH: Normally interactive. Conversant. Not depressed or anxious appearing.   Calm demeanor.    Assessment and Plan: Encounter for drug screening  Performed a drug screen for her today- await results which should be in tomorrow No other concern today   Signed Abbe Amsterdam, MD

## 2012-11-06 ENCOUNTER — Other Ambulatory Visit: Payer: Self-pay | Admitting: Gynecologic Oncology

## 2012-12-30 IMAGING — CR DG CHEST 2V
2 series · 2 of 2 positions shown · non-contrast
Comparison: None.

CLINICAL DATA: Endometrial carcinoma

CHEST - 2 VIEW

[w chest pa]
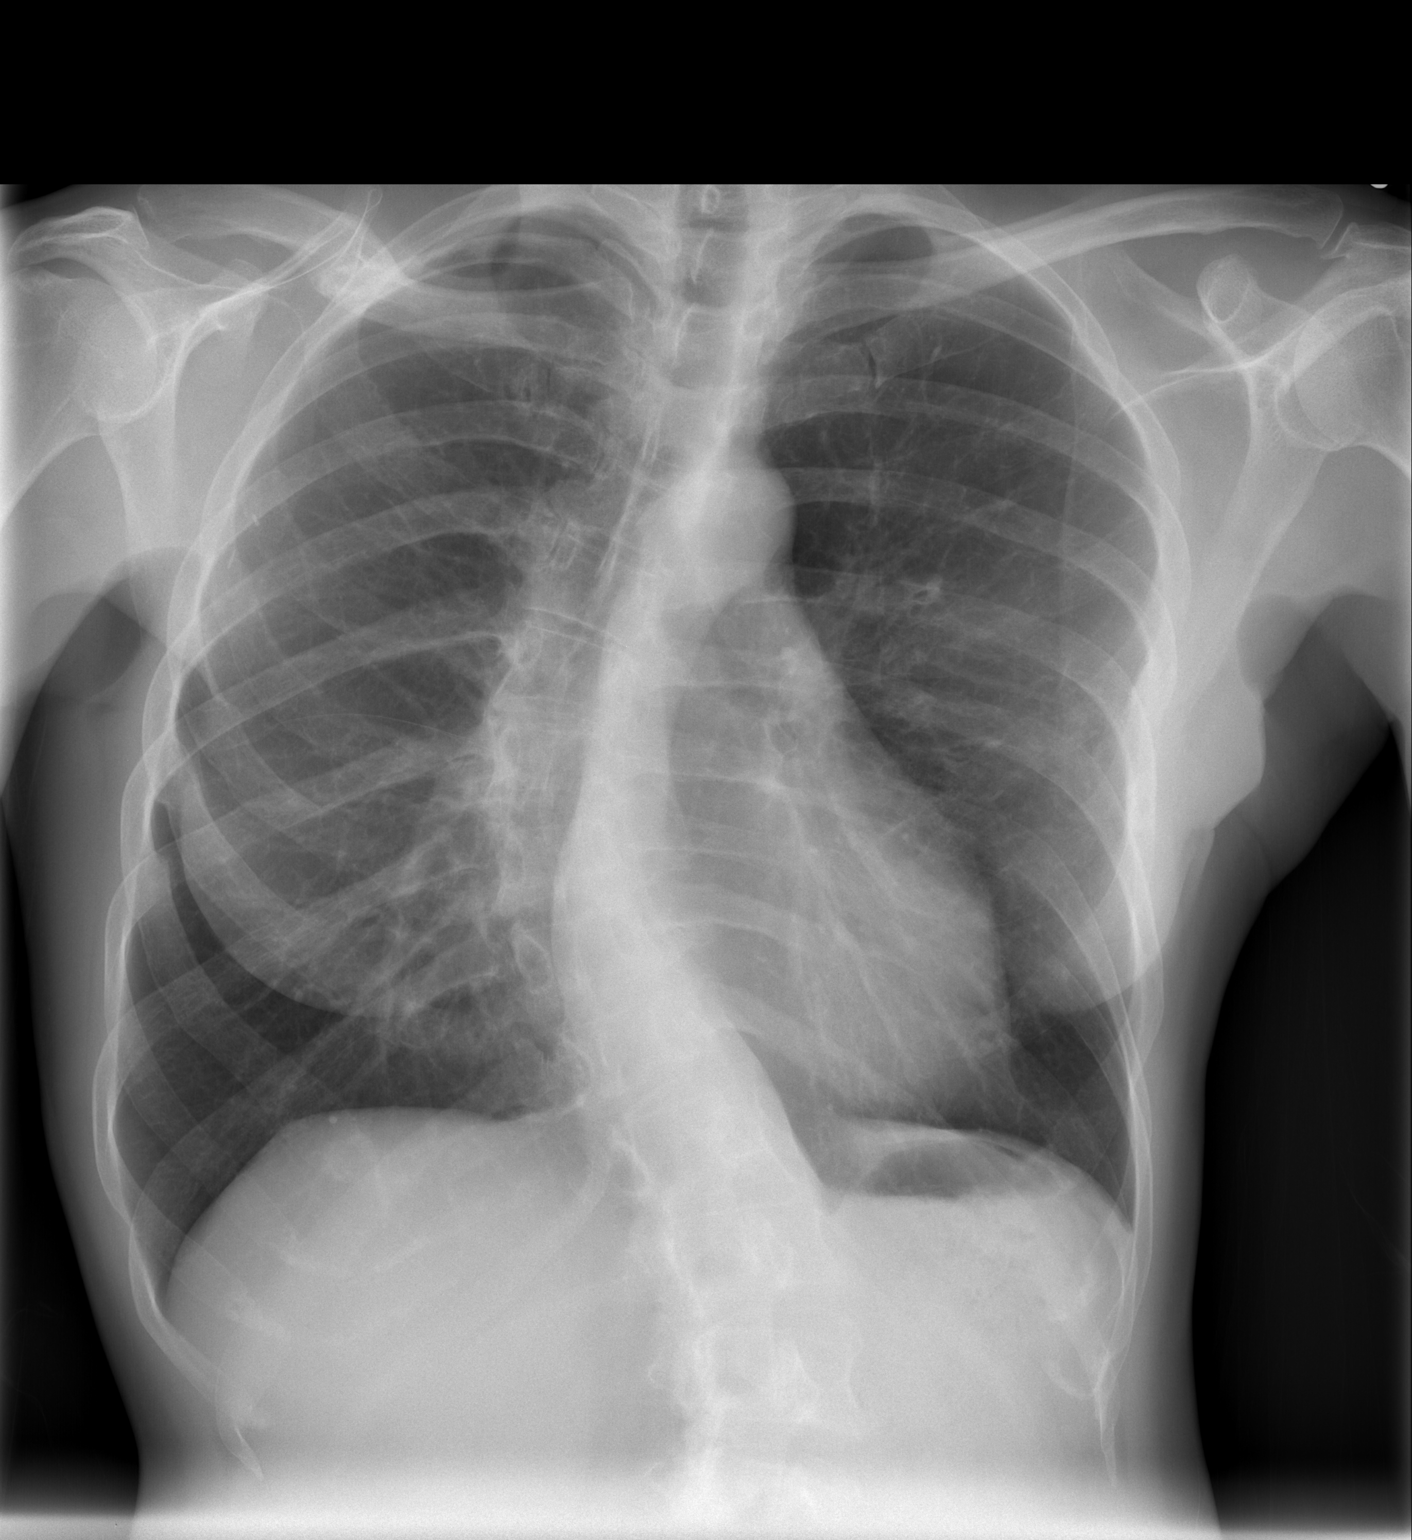

[w chest lat]
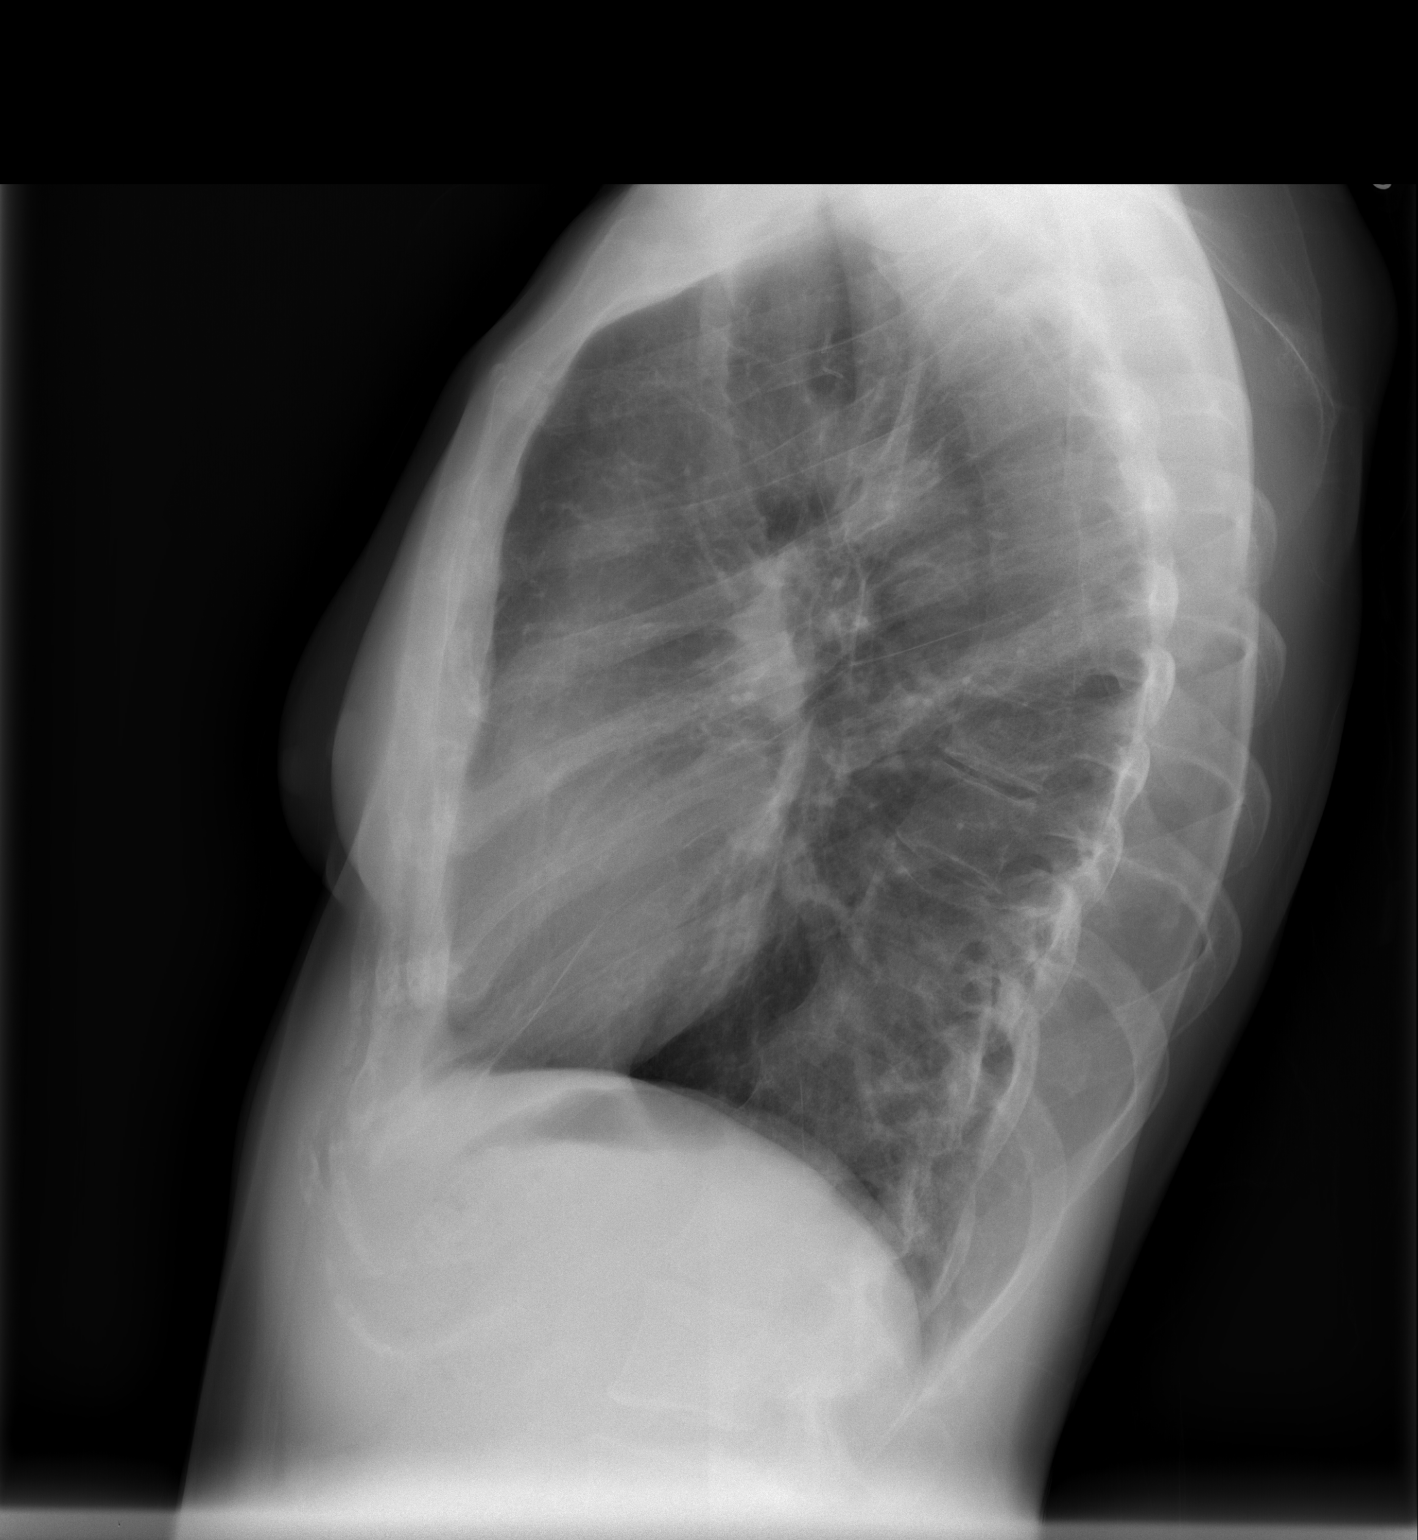

[2 of 2 positions shown; findings below may reference images not displayed]

FINDINGS: The lungs are clear.  The heart size and pulmonary
vascularity are normal.  No adenopathy.  There are clips in the
right axillary region.

There is mid thoracic dextroscoliosis with thoracolumbar
levoscoliosis.  There are no blastic or lytic bone lesions.  There
is evidence of old trauma involving the right clavicle.
IMPRESSION: Scoliosis.  Lungs clear.  No demonstrable adenopathy.

## 2013-08-04 ENCOUNTER — Other Ambulatory Visit: Payer: Self-pay | Admitting: Family Medicine

## 2013-08-04 ENCOUNTER — Other Ambulatory Visit (HOSPITAL_COMMUNITY)
Admission: RE | Admit: 2013-08-04 | Discharge: 2013-08-04 | Disposition: A | Discharge status: Home / Self Care | Payer: BC Managed Care – PPO | Source: Ambulatory Visit | Attending: Family Medicine | Admitting: Family Medicine

## 2013-08-04 DIAGNOSIS — Z1151 Encounter for screening for human papillomavirus (HPV): Secondary | ICD-10-CM | POA: Insufficient documentation

## 2013-08-04 DIAGNOSIS — Z124 Encounter for screening for malignant neoplasm of cervix: Secondary | ICD-10-CM | POA: Insufficient documentation

## 2014-08-23 ENCOUNTER — Other Ambulatory Visit: Payer: Self-pay

## 2014-08-23 DIAGNOSIS — Z1231 Encounter for screening mammogram for malignant neoplasm of breast: Secondary | ICD-10-CM

## 2014-08-24 ENCOUNTER — Ambulatory Visit
Admission: RE | Admit: 2014-08-24 | Discharge: 2014-08-24 | Disposition: A | Discharge status: Home / Self Care | Payer: BLUE CROSS/BLUE SHIELD | Source: Ambulatory Visit

## 2014-08-24 DIAGNOSIS — Z1231 Encounter for screening mammogram for malignant neoplasm of breast: Secondary | ICD-10-CM

## 2014-08-25 ENCOUNTER — Other Ambulatory Visit: Payer: Self-pay | Admitting: Family Medicine

## 2014-08-25 DIAGNOSIS — R928 Other abnormal and inconclusive findings on diagnostic imaging of breast: Secondary | ICD-10-CM

## 2014-09-02 ENCOUNTER — Ambulatory Visit
Admission: RE | Admit: 2014-09-02 | Discharge: 2014-09-02 | Disposition: A | Discharge status: Home / Self Care | Payer: BLUE CROSS/BLUE SHIELD | Source: Ambulatory Visit | Attending: Family Medicine | Admitting: Family Medicine

## 2014-09-02 DIAGNOSIS — R928 Other abnormal and inconclusive findings on diagnostic imaging of breast: Secondary | ICD-10-CM

## 2015-08-22 ENCOUNTER — Other Ambulatory Visit: Payer: Self-pay

## 2015-08-22 DIAGNOSIS — Z1231 Encounter for screening mammogram for malignant neoplasm of breast: Secondary | ICD-10-CM

## 2015-08-30 ENCOUNTER — Other Ambulatory Visit (HOSPITAL_COMMUNITY)
Admission: RE | Admit: 2015-08-30 | Discharge: 2015-08-30 | Disposition: A | Discharge status: Home / Self Care | Payer: BLUE CROSS/BLUE SHIELD | Source: Ambulatory Visit | Attending: Family Medicine | Admitting: Family Medicine

## 2015-08-30 ENCOUNTER — Other Ambulatory Visit: Payer: Self-pay | Admitting: Family Medicine

## 2015-08-30 DIAGNOSIS — N760 Acute vaginitis: Secondary | ICD-10-CM | POA: Insufficient documentation

## 2015-08-30 DIAGNOSIS — Z01411 Encounter for gynecological examination (general) (routine) with abnormal findings: Secondary | ICD-10-CM | POA: Diagnosis not present

## 2015-08-30 DIAGNOSIS — Z1151 Encounter for screening for human papillomavirus (HPV): Secondary | ICD-10-CM | POA: Insufficient documentation

## 2015-08-30 DIAGNOSIS — Z113 Encounter for screening for infections with a predominantly sexual mode of transmission: Secondary | ICD-10-CM | POA: Diagnosis not present

## 2015-08-30 DIAGNOSIS — Z008 Encounter for other general examination: Secondary | ICD-10-CM | POA: Diagnosis not present

## 2015-08-30 DIAGNOSIS — Z0000 Encounter for general adult medical examination without abnormal findings: Secondary | ICD-10-CM | POA: Diagnosis not present

## 2015-08-30 DIAGNOSIS — Z853 Personal history of malignant neoplasm of breast: Secondary | ICD-10-CM | POA: Diagnosis not present

## 2015-08-30 DIAGNOSIS — I10 Essential (primary) hypertension: Secondary | ICD-10-CM | POA: Diagnosis not present

## 2015-09-06 LAB — CYTOLOGY - PAP

## 2015-09-09 ENCOUNTER — Ambulatory Visit: Payer: BLUE CROSS/BLUE SHIELD

## 2015-09-26 DIAGNOSIS — I10 Essential (primary) hypertension: Secondary | ICD-10-CM | POA: Diagnosis not present

## 2016-01-04 ENCOUNTER — Ambulatory Visit
Admission: RE | Admit: 2016-01-04 | Discharge: 2016-01-04 | Disposition: A | Discharge status: Home / Self Care | Payer: BLUE CROSS/BLUE SHIELD | Source: Ambulatory Visit

## 2016-01-04 DIAGNOSIS — Z1231 Encounter for screening mammogram for malignant neoplasm of breast: Secondary | ICD-10-CM | POA: Diagnosis not present

## 2016-01-06 DIAGNOSIS — H5213 Myopia, bilateral: Secondary | ICD-10-CM | POA: Diagnosis not present

## 2016-01-26 DIAGNOSIS — F419 Anxiety disorder, unspecified: Secondary | ICD-10-CM | POA: Diagnosis not present

## 2016-05-31 DIAGNOSIS — I10 Essential (primary) hypertension: Secondary | ICD-10-CM | POA: Diagnosis not present

## 2016-05-31 DIAGNOSIS — F419 Anxiety disorder, unspecified: Secondary | ICD-10-CM | POA: Diagnosis not present

## 2016-08-30 ENCOUNTER — Other Ambulatory Visit: Payer: Self-pay | Admitting: Family Medicine

## 2016-08-30 ENCOUNTER — Other Ambulatory Visit (HOSPITAL_COMMUNITY)
Admission: RE | Admit: 2016-08-30 | Discharge: 2016-08-30 | Disposition: A | Discharge status: Home / Self Care | Payer: BLUE CROSS/BLUE SHIELD | Source: Ambulatory Visit | Attending: Family Medicine | Admitting: Family Medicine

## 2016-08-30 DIAGNOSIS — Z113 Encounter for screening for infections with a predominantly sexual mode of transmission: Secondary | ICD-10-CM | POA: Insufficient documentation

## 2016-08-30 DIAGNOSIS — Z0000 Encounter for general adult medical examination without abnormal findings: Secondary | ICD-10-CM | POA: Diagnosis not present

## 2016-08-30 DIAGNOSIS — R635 Abnormal weight gain: Secondary | ICD-10-CM | POA: Diagnosis not present

## 2016-08-30 DIAGNOSIS — F419 Anxiety disorder, unspecified: Secondary | ICD-10-CM | POA: Diagnosis not present

## 2016-08-30 DIAGNOSIS — Z01411 Encounter for gynecological examination (general) (routine) with abnormal findings: Secondary | ICD-10-CM | POA: Insufficient documentation

## 2016-08-30 DIAGNOSIS — C3400 Malignant neoplasm of unspecified main bronchus: Secondary | ICD-10-CM | POA: Diagnosis not present

## 2016-08-30 DIAGNOSIS — Z1151 Encounter for screening for human papillomavirus (HPV): Secondary | ICD-10-CM | POA: Diagnosis not present

## 2016-08-30 DIAGNOSIS — I10 Essential (primary) hypertension: Secondary | ICD-10-CM | POA: Diagnosis not present

## 2016-09-04 LAB — CYTOLOGY - PAP
Bacterial vaginitis: NEGATIVE
CANDIDA VAGINITIS: NEGATIVE
CHLAMYDIA, DNA PROBE: NEGATIVE
Diagnosis: NEGATIVE
HERPES (WINDOWPATH): NEGATIVE
HPV: NOT DETECTED
NEISSERIA GONORRHEA: NEGATIVE
Trichomonas: NEGATIVE

## 2017-01-03 DIAGNOSIS — I10 Essential (primary) hypertension: Secondary | ICD-10-CM | POA: Diagnosis not present

## 2017-03-02 DIAGNOSIS — H5213 Myopia, bilateral: Secondary | ICD-10-CM | POA: Diagnosis not present

## 2017-04-29 ENCOUNTER — Other Ambulatory Visit: Payer: Self-pay | Admitting: Family Medicine

## 2017-04-29 DIAGNOSIS — Z1231 Encounter for screening mammogram for malignant neoplasm of breast: Secondary | ICD-10-CM

## 2017-05-02 DIAGNOSIS — H16102 Unspecified superficial keratitis, left eye: Secondary | ICD-10-CM | POA: Diagnosis not present

## 2017-05-09 DIAGNOSIS — I10 Essential (primary) hypertension: Secondary | ICD-10-CM | POA: Diagnosis not present

## 2017-05-17 ENCOUNTER — Ambulatory Visit: Payer: BLUE CROSS/BLUE SHIELD

## 2017-09-10 DIAGNOSIS — J301 Allergic rhinitis due to pollen: Secondary | ICD-10-CM | POA: Diagnosis not present

## 2017-09-10 DIAGNOSIS — C3400 Malignant neoplasm of unspecified main bronchus: Secondary | ICD-10-CM | POA: Diagnosis not present

## 2017-09-10 DIAGNOSIS — I10 Essential (primary) hypertension: Secondary | ICD-10-CM | POA: Diagnosis not present

## 2017-09-10 DIAGNOSIS — F419 Anxiety disorder, unspecified: Secondary | ICD-10-CM | POA: Diagnosis not present

## 2017-09-10 DIAGNOSIS — Z0000 Encounter for general adult medical examination without abnormal findings: Secondary | ICD-10-CM | POA: Diagnosis not present

## 2017-09-10 DIAGNOSIS — C44311 Basal cell carcinoma of skin of nose: Secondary | ICD-10-CM | POA: Diagnosis not present

## 2017-09-10 DIAGNOSIS — R799 Abnormal finding of blood chemistry, unspecified: Secondary | ICD-10-CM | POA: Diagnosis not present

## 2017-09-27 ENCOUNTER — Ambulatory Visit
Admission: RE | Admit: 2017-09-27 | Discharge: 2017-09-27 | Disposition: A | Discharge status: Home / Self Care | Payer: BLUE CROSS/BLUE SHIELD | Source: Ambulatory Visit | Attending: Family Medicine | Admitting: Family Medicine

## 2017-09-27 DIAGNOSIS — Z1231 Encounter for screening mammogram for malignant neoplasm of breast: Secondary | ICD-10-CM | POA: Diagnosis not present

## 2018-01-14 DIAGNOSIS — C3400 Malignant neoplasm of unspecified main bronchus: Secondary | ICD-10-CM | POA: Diagnosis not present

## 2018-01-14 DIAGNOSIS — I10 Essential (primary) hypertension: Secondary | ICD-10-CM | POA: Diagnosis not present

## 2018-01-14 DIAGNOSIS — Z853 Personal history of malignant neoplasm of breast: Secondary | ICD-10-CM | POA: Diagnosis not present

## 2018-01-14 DIAGNOSIS — F419 Anxiety disorder, unspecified: Secondary | ICD-10-CM | POA: Diagnosis not present

## 2018-01-31 DIAGNOSIS — D485 Neoplasm of uncertain behavior of skin: Secondary | ICD-10-CM | POA: Diagnosis not present

## 2018-01-31 DIAGNOSIS — L821 Other seborrheic keratosis: Secondary | ICD-10-CM | POA: Diagnosis not present

## 2018-01-31 DIAGNOSIS — D2310 Other benign neoplasm of skin of unspecified eyelid, including canthus: Secondary | ICD-10-CM | POA: Diagnosis not present

## 2018-06-04 DIAGNOSIS — I10 Essential (primary) hypertension: Secondary | ICD-10-CM | POA: Diagnosis not present

## 2018-09-23 ENCOUNTER — Other Ambulatory Visit: Payer: Self-pay | Admitting: Family Medicine

## 2018-09-23 DIAGNOSIS — Z1231 Encounter for screening mammogram for malignant neoplasm of breast: Secondary | ICD-10-CM

## 2018-09-25 DIAGNOSIS — Z0000 Encounter for general adult medical examination without abnormal findings: Secondary | ICD-10-CM | POA: Diagnosis not present

## 2018-09-25 DIAGNOSIS — F064 Anxiety disorder due to known physiological condition: Secondary | ICD-10-CM | POA: Diagnosis not present

## 2018-09-25 DIAGNOSIS — I10 Essential (primary) hypertension: Secondary | ICD-10-CM | POA: Diagnosis not present

## 2018-09-25 DIAGNOSIS — Z0001 Encounter for general adult medical examination with abnormal findings: Secondary | ICD-10-CM | POA: Diagnosis not present

## 2018-09-25 DIAGNOSIS — R799 Abnormal finding of blood chemistry, unspecified: Secondary | ICD-10-CM | POA: Diagnosis not present

## 2018-09-25 DIAGNOSIS — R7309 Other abnormal glucose: Secondary | ICD-10-CM | POA: Diagnosis not present

## 2018-10-29 DIAGNOSIS — U071 COVID-19: Secondary | ICD-10-CM | POA: Diagnosis not present

## 2018-11-13 DIAGNOSIS — H524 Presbyopia: Secondary | ICD-10-CM | POA: Diagnosis not present

## 2018-11-14 ENCOUNTER — Ambulatory Visit
Admission: RE | Admit: 2018-11-14 | Discharge: 2018-11-14 | Disposition: A | Discharge status: Home / Self Care | Payer: BLUE CROSS/BLUE SHIELD | Source: Ambulatory Visit | Attending: Family Medicine | Admitting: Family Medicine

## 2018-11-14 ENCOUNTER — Other Ambulatory Visit: Payer: Self-pay

## 2018-11-14 DIAGNOSIS — Z1231 Encounter for screening mammogram for malignant neoplasm of breast: Secondary | ICD-10-CM

## 2019-01-01 ENCOUNTER — Other Ambulatory Visit: Payer: Self-pay

## 2019-01-01 DIAGNOSIS — Z20822 Contact with and (suspected) exposure to covid-19: Secondary | ICD-10-CM

## 2019-01-02 LAB — NOVEL CORONAVIRUS, NAA: SARS-CoV-2, NAA: NOT DETECTED

## 2019-01-26 DIAGNOSIS — I10 Essential (primary) hypertension: Secondary | ICD-10-CM | POA: Diagnosis not present

## 2019-01-26 DIAGNOSIS — Z853 Personal history of malignant neoplasm of breast: Secondary | ICD-10-CM | POA: Diagnosis not present

## 2019-01-26 DIAGNOSIS — F064 Anxiety disorder due to known physiological condition: Secondary | ICD-10-CM | POA: Diagnosis not present

## 2019-01-26 DIAGNOSIS — C3400 Malignant neoplasm of unspecified main bronchus: Secondary | ICD-10-CM | POA: Diagnosis not present

## 2019-05-23 DIAGNOSIS — Z20828 Contact with and (suspected) exposure to other viral communicable diseases: Secondary | ICD-10-CM | POA: Diagnosis not present

## 2019-05-23 DIAGNOSIS — Z03818 Encounter for observation for suspected exposure to other biological agents ruled out: Secondary | ICD-10-CM | POA: Diagnosis not present

## 2019-06-03 DIAGNOSIS — Z20828 Contact with and (suspected) exposure to other viral communicable diseases: Secondary | ICD-10-CM | POA: Diagnosis not present

## 2019-08-01 DIAGNOSIS — F330 Major depressive disorder, recurrent, mild: Secondary | ICD-10-CM | POA: Diagnosis not present

## 2019-08-01 DIAGNOSIS — I10 Essential (primary) hypertension: Secondary | ICD-10-CM | POA: Diagnosis not present

## 2019-11-17 ENCOUNTER — Other Ambulatory Visit (HOSPITAL_COMMUNITY)
Admission: RE | Admit: 2019-11-17 | Discharge: 2019-11-17 | Disposition: A | Discharge status: Home / Self Care | Payer: BC Managed Care – PPO | Source: Ambulatory Visit | Attending: Family Medicine | Admitting: Family Medicine

## 2019-11-17 ENCOUNTER — Other Ambulatory Visit: Payer: Self-pay | Admitting: Family Medicine

## 2019-11-17 DIAGNOSIS — F411 Generalized anxiety disorder: Secondary | ICD-10-CM | POA: Diagnosis not present

## 2019-11-17 DIAGNOSIS — Z124 Encounter for screening for malignant neoplasm of cervix: Secondary | ICD-10-CM | POA: Diagnosis not present

## 2019-11-17 DIAGNOSIS — R7309 Other abnormal glucose: Secondary | ICD-10-CM | POA: Diagnosis not present

## 2019-11-17 DIAGNOSIS — E559 Vitamin D deficiency, unspecified: Secondary | ICD-10-CM | POA: Diagnosis not present

## 2019-11-17 DIAGNOSIS — Z0001 Encounter for general adult medical examination with abnormal findings: Secondary | ICD-10-CM | POA: Diagnosis not present

## 2019-11-17 DIAGNOSIS — I10 Essential (primary) hypertension: Secondary | ICD-10-CM | POA: Diagnosis not present

## 2019-11-20 LAB — CYTOLOGY - PAP
Comment: NEGATIVE
Diagnosis: NEGATIVE
High risk HPV: NEGATIVE

## 2019-12-16 ENCOUNTER — Other Ambulatory Visit: Payer: Self-pay | Admitting: Family Medicine

## 2019-12-16 DIAGNOSIS — Z1231 Encounter for screening mammogram for malignant neoplasm of breast: Secondary | ICD-10-CM

## 2019-12-18 ENCOUNTER — Ambulatory Visit
Admission: RE | Admit: 2019-12-18 | Discharge: 2019-12-18 | Disposition: A | Discharge status: Home / Self Care | Payer: BC Managed Care – PPO | Source: Ambulatory Visit | Attending: Family Medicine | Admitting: Family Medicine

## 2019-12-18 ENCOUNTER — Other Ambulatory Visit: Payer: Self-pay

## 2019-12-18 DIAGNOSIS — Z1231 Encounter for screening mammogram for malignant neoplasm of breast: Secondary | ICD-10-CM

## 2019-12-22 DIAGNOSIS — Z03818 Encounter for observation for suspected exposure to other biological agents ruled out: Secondary | ICD-10-CM | POA: Diagnosis not present

## 2019-12-23 DIAGNOSIS — Z03818 Encounter for observation for suspected exposure to other biological agents ruled out: Secondary | ICD-10-CM | POA: Diagnosis not present

## 2020-03-25 DIAGNOSIS — E550 Rickets, active: Secondary | ICD-10-CM | POA: Diagnosis not present

## 2020-03-25 DIAGNOSIS — I10 Essential (primary) hypertension: Secondary | ICD-10-CM | POA: Diagnosis not present

## 2020-04-26 DIAGNOSIS — J069 Acute upper respiratory infection, unspecified: Secondary | ICD-10-CM | POA: Diagnosis not present

## 2020-04-26 DIAGNOSIS — C3400 Malignant neoplasm of unspecified main bronchus: Secondary | ICD-10-CM | POA: Diagnosis not present

## 2020-04-27 DIAGNOSIS — Z9229 Personal history of other drug therapy: Secondary | ICD-10-CM | POA: Diagnosis not present

## 2020-04-27 DIAGNOSIS — Z86010 Personal history of colonic polyps: Secondary | ICD-10-CM | POA: Diagnosis not present

## 2020-04-27 DIAGNOSIS — Z01818 Encounter for other preprocedural examination: Secondary | ICD-10-CM | POA: Diagnosis not present

## 2020-05-16 DIAGNOSIS — Z1211 Encounter for screening for malignant neoplasm of colon: Secondary | ICD-10-CM | POA: Diagnosis not present

## 2020-05-16 DIAGNOSIS — Z01818 Encounter for other preprocedural examination: Secondary | ICD-10-CM | POA: Diagnosis not present

## 2020-05-16 DIAGNOSIS — K635 Polyp of colon: Secondary | ICD-10-CM | POA: Diagnosis not present

## 2020-05-19 DIAGNOSIS — K635 Polyp of colon: Secondary | ICD-10-CM | POA: Diagnosis not present

## 2020-06-14 DIAGNOSIS — K648 Other hemorrhoids: Secondary | ICD-10-CM | POA: Diagnosis not present

## 2020-06-14 DIAGNOSIS — D126 Benign neoplasm of colon, unspecified: Secondary | ICD-10-CM | POA: Diagnosis not present

## 2020-11-17 DIAGNOSIS — Z0001 Encounter for general adult medical examination with abnormal findings: Secondary | ICD-10-CM | POA: Diagnosis not present

## 2020-11-17 DIAGNOSIS — E559 Vitamin D deficiency, unspecified: Secondary | ICD-10-CM | POA: Diagnosis not present

## 2020-11-17 DIAGNOSIS — L910 Hypertrophic scar: Secondary | ICD-10-CM | POA: Diagnosis not present

## 2020-11-17 DIAGNOSIS — I10 Essential (primary) hypertension: Secondary | ICD-10-CM | POA: Diagnosis not present

## 2020-11-30 DIAGNOSIS — L910 Hypertrophic scar: Secondary | ICD-10-CM | POA: Diagnosis not present

## 2020-11-30 DIAGNOSIS — D23112 Other benign neoplasm of skin of right lower eyelid, including canthus: Secondary | ICD-10-CM | POA: Diagnosis not present

## 2021-03-01 ENCOUNTER — Other Ambulatory Visit: Payer: Self-pay

## 2021-03-01 ENCOUNTER — Ambulatory Visit (INDEPENDENT_AMBULATORY_CARE_PROVIDER_SITE_OTHER): Payer: BC Managed Care – PPO | Admitting: Plastic Surgery

## 2021-03-01 VITALS — BP 117/74 | HR 71 | Ht 69.0 in | Wt 202.8 lb

## 2021-03-01 DIAGNOSIS — L910 Hypertrophic scar: Secondary | ICD-10-CM | POA: Diagnosis not present

## 2021-03-01 DIAGNOSIS — L989 Disorder of the skin and subcutaneous tissue, unspecified: Secondary | ICD-10-CM

## 2021-03-01 NOTE — Progress Notes (Signed)
Referring Provider Lucianne Lei, MD [address] [place],  [place] [address]   CC:  Chief Complaint  Patient presents with   Advice Only      Allison Rivera is an 60 y.o. female.  HPI: Patient presents to discuss left axillary scar and a left lower lid lesion.  Left axillary scar is at a site where her lymph node excision was performed as treatment for left breast cancer.  She had a lumpectomy and radiation to that side as well.  No lymphedema in her arm.  This was over 15 years ago.  Along the scar there is been a increase in size in the anterior aspect of the scar and its become larger and painful.  She is interested in having it excised.  She has formed keloids in the past.  She is not interested in a steroid injection.  On her left lower lid she reports a small cystic lesion laterally that will occasionally interfere with her vision and it is getting bigger.  No Known Allergies  Outpatient Encounter Medications as of 03/01/2021  Medication Sig   acetaminophen (TYLENOL) 325 MG tablet Take 650 mg by mouth daily as needed. headache   amLODipine (NORVASC) 5 MG tablet Take 5 mg by mouth at bedtime.    ibuprofen (ADVIL,MOTRIN) 800 MG tablet Take 1 tablet (800 mg total) by mouth every 8 (eight) hours as needed for pain.   Multiple Vitamin (MULTIVITAMIN WITH MINERALS) TABS Take 1 tablet by mouth daily.   Aliskiren-hydroCHLOROthiazide 300-25 MG TABS Take 1 tablet by mouth at bedtime.  (Patient not taking: Reported on 03/01/2021)   ALPRAZolam (XANAX) 1 MG tablet Take 1 mg by mouth 3 (three) times daily with meals as needed.  (Patient not taking: Reported on 03/01/2021)   oxyCODONE-acetaminophen (PERCOCET/ROXICET) 5-325 MG per tablet Take 1-2 tablets by mouth every 4 (four) hours as needed (moderate to severe pain (when tolerating fluids)). (Patient not taking: Reported on 03/01/2021)   potassium chloride SA (K-DUR,KLOR-CON) 20 MEQ tablet Take 20 mEq by mouth daily. For 2 days only   Start  date:01/14/12 (Patient not taking: Reported on 03/01/2021)   venlafaxine XR (EFFEXOR-XR) 75 MG 24 hr capsule TAKE 1 CAPSULE (75 MG TOTAL) BY MOUTH DAILY. (Patient not taking: Reported on 03/01/2021)   No facility-administered encounter medications on file as of 03/01/2021.     Past Medical History:  Diagnosis Date   Anxiety    Cancer [facility])    breast   Hypertension     Past Surgical History:  Procedure Laterality Date   ABDOMINAL HYSTERECTOMY     BREAST BIOPSY  2003   BREAST EXCISIONAL BIOPSY  2000   BREAST LUMPECTOMY Left 2000   BREAST SURGERY  2000   lumpectomy   COLONOSCOPY  2013   DILATION AND CURETTAGE OF UTERUS  12/07/2011   pre-cancerous cells   LYMPH NODE DISSECTION  01/15/2012   Procedure: LYMPH NODE DISSECTION;  Surgeon: Imagene Gurney A. Alycia Rossetti, MD;  Location: [facility];  Service: Gynecology;;   ROBOTIC ASSISTED TOTAL HYSTERECTOMY WITH BILATERAL SALPINGO OOPHERECTOMY  01/15/12   bilateral pelvic lymph node dissection    Family History  Problem Relation Age of Onset   Breast cancer Sister    Breast cancer Maternal Aunt    Breast cancer Cousin    Breast cancer Cousin    Breast cancer Maternal Aunt    Breast cancer Maternal Aunt     Social History   Social History Narrative  Not on file     Review of Systems General: Denies fevers, chills, weight loss CV: Denies chest pain, shortness of breath, palpitations  Physical Exam Vitals with BMI 03/01/2021 11/05/2012 08/28/2012  Height 5\' 9"  5\' 8"  5\' 9"   Weight 202 lbs 13 oz 145 lbs 148 lbs  BMI 29.93 00.7 62.2  Systolic 633 354 562  Diastolic 74 84 90  Pulse 71 72 92    General:  No acute distress,  Alert and oriented, Non-Toxic, Normal speech and affect Examination shows a enlarged scar in the anterior aspect in her left axilla.  The keloid portion of this is probably 2 to 3 cm x 1-1/2 cm.  It is tender to touch.  On her lower eyelid there is a small cystic lesion that is probably 5 mm in greatest dimension.  Its at least 5  mm away from her eyelid margin.  No obvious overlying skin changes.  Assessment/Plan Had a discussion with Terrika today about her concerns.  Regarding the scar in her left axilla I do think a excision could be performed in a primary closure done.  We did discuss the risk of recurrence and other risks include bleeding, infection, damage to surrounding structures and need for additional procedures.  I would send it to pathology given her history.  She is fully understanding.  I also believe the left lower eyelid cyst can be removed at the same time and we would send this to pathology as well.  All of her questions were answered we will plan to arrange this for her.  Cindra Presume 03/01/2021, 9:34 AM

## 2021-03-20 DIAGNOSIS — I10 Essential (primary) hypertension: Secondary | ICD-10-CM | POA: Diagnosis not present

## 2021-03-20 DIAGNOSIS — Z23 Encounter for immunization: Secondary | ICD-10-CM | POA: Diagnosis not present

## 2021-04-20 ENCOUNTER — Encounter: Payer: Self-pay | Admitting: Plastic Surgery

## 2021-04-20 ENCOUNTER — Other Ambulatory Visit: Payer: Self-pay

## 2021-04-20 ENCOUNTER — Ambulatory Visit: Payer: BC Managed Care – PPO | Admitting: Plastic Surgery

## 2021-04-20 ENCOUNTER — Other Ambulatory Visit (HOSPITAL_COMMUNITY)
Admission: RE | Admit: 2021-04-20 | Discharge: 2021-04-20 | Disposition: A | Discharge status: Home / Self Care | Payer: BC Managed Care – PPO | Source: Ambulatory Visit | Attending: Plastic Surgery | Admitting: Plastic Surgery

## 2021-04-20 VITALS — BP 138/86 | HR 84 | Ht 69.0 in | Wt 202.6 lb

## 2021-04-20 DIAGNOSIS — L989 Disorder of the skin and subcutaneous tissue, unspecified: Secondary | ICD-10-CM | POA: Insufficient documentation

## 2021-04-20 DIAGNOSIS — L910 Hypertrophic scar: Secondary | ICD-10-CM | POA: Diagnosis not present

## 2021-04-20 NOTE — Progress Notes (Signed)
Operative Note   DATE OF OPERATION: 04/20/2021  LOCATION:    SURGICAL DEPARTMENT: Plastic Surgery  PREOPERATIVE DIAGNOSES: Left lower eyelid cyst and left breast keloid  POSTOPERATIVE DIAGNOSES:  same  PROCEDURE:  Excision of left lower eyelid cyst totaling 1 cm and left breast keloid totaling 5 cm  Complex closure measuring left lower eyelid totaling 1 cm and left breast totaling 5 cm  SURGEON: Talmadge Coventry, MD  ANESTHESIA:  Local  COMPLICATIONS: None.   INDICATIONS FOR PROCEDURE:  The patient, Allison Rivera is a 60 y.o. female born on 08/26/1960, is here for treatment of symptomatic left lower eyelid cyst and left breast keloid MRN: [id number]  CONSENT:  Informed consent was obtained directly from the patient. Risks, benefits and alternatives were fully discussed. Specific risks including but not limited to bleeding, infection, hematoma, seroma, scarring, pain, infection, wound healing problems, and need for further surgery were all discussed. The patient did have an ample opportunity to have questions answered to satisfaction.   DESCRIPTION OF PROCEDURE:  Local anesthesia was administered. The patient's operative site was prepped and draped in a sterile fashion. A time out was performed and all information was confirmed to be correct.  The lesions were excised with a 15 blade.  Hemostasis was obtained.  Circumferential undermining was performed and the skin was advanced and closed in layers with interrupted buried Monocryl sutures and 5-0 fast gut for the skin.    The patient tolerated the procedure well.  There were no complications.

## 2021-04-25 LAB — SURGICAL PATHOLOGY

## 2021-04-25 NOTE — Progress Notes (Signed)
Patient is a 61 year old female with PMH of left axillary keloid and left eyelid cyst s/p excision and primary closure performed 04/20/2021 by Dr. Claudia Desanctis who presents to clinic for postoperative follow-up.  I reviewed the procedural note and the incision was closed deep with interrupted buried Monocryl sutures and superficially with 5-0 fast gut.  Today, patient is doing well.  She states that there was a moderate amount of swelling underneath her left eye shortly after the procedure was performed.  It has since improved considerably.  She now only has some mild bruising and small area of tenderness.  She cannot see the excision site underneath her eyelid.  As for her axillary incision, no complaints.  Denies any bruising, swelling, or drainage.  Physical exam is entirely reassuring.  The incisions are both healing well, no cellulitic findings.  No areas of dehiscence.  Mild bruising noted inferolaterally to left eye, no considerable periorbital swelling.  EOM intact.  Axillary incision closed with simple running 5-0 fast gut sutures.  They were already beginning to absorb, residual sutures were trimmed/removed without complication or difficulty.  No dehiscence.  Recommended continued activity modification with left arm as well as scar mitigation cream.  Discussed Mederma specifically.  Given that this is a new scar, recommend that they apply Mederma once daily at night x8 weeks.  She is otherwise cleared from a postprocedural standpoint.  Discussed possible steroid injections to help mitigate risk of keloiding in the future, but she declines and states that it took over 12 years for her keloid to become large enough for it to be an issue for her.  Picture(s) obtained of the patient and placed in the chart were with the patient's or guardian's permission.

## 2021-04-27 ENCOUNTER — Ambulatory Visit: Payer: BC Managed Care – PPO | Admitting: Physician Assistant

## 2021-04-27 ENCOUNTER — Other Ambulatory Visit: Payer: Self-pay

## 2021-04-27 DIAGNOSIS — L989 Disorder of the skin and subcutaneous tissue, unspecified: Secondary | ICD-10-CM

## 2021-06-26 ENCOUNTER — Other Ambulatory Visit: Payer: Self-pay | Admitting: Family Medicine

## 2021-06-26 DIAGNOSIS — Z1231 Encounter for screening mammogram for malignant neoplasm of breast: Secondary | ICD-10-CM

## 2021-07-04 ENCOUNTER — Ambulatory Visit
Admission: RE | Admit: 2021-07-04 | Discharge: 2021-07-04 | Disposition: A | Discharge status: Home / Self Care | Payer: BC Managed Care – PPO | Source: Ambulatory Visit | Attending: Family Medicine | Admitting: Family Medicine

## 2021-07-04 DIAGNOSIS — Z1231 Encounter for screening mammogram for malignant neoplasm of breast: Secondary | ICD-10-CM

## 2021-12-22 DIAGNOSIS — R69 Illness, unspecified: Secondary | ICD-10-CM | POA: Diagnosis not present

## 2021-12-22 DIAGNOSIS — Z6830 Body mass index (BMI) 30.0-30.9, adult: Secondary | ICD-10-CM | POA: Diagnosis not present

## 2021-12-22 DIAGNOSIS — I10 Essential (primary) hypertension: Secondary | ICD-10-CM | POA: Diagnosis not present

## 2021-12-22 DIAGNOSIS — R635 Abnormal weight gain: Secondary | ICD-10-CM | POA: Diagnosis not present

## 2021-12-22 DIAGNOSIS — Z01411 Encounter for gynecological examination (general) (routine) with abnormal findings: Secondary | ICD-10-CM | POA: Diagnosis not present

## 2021-12-22 DIAGNOSIS — E559 Vitamin D deficiency, unspecified: Secondary | ICD-10-CM | POA: Diagnosis not present

## 2022-07-13 DIAGNOSIS — F060 Psychotic disorder with hallucinations due to known physiological condition: Secondary | ICD-10-CM | POA: Diagnosis not present

## 2022-07-13 DIAGNOSIS — I10 Essential (primary) hypertension: Secondary | ICD-10-CM | POA: Diagnosis not present

## 2022-07-20 DIAGNOSIS — Z1231 Encounter for screening mammogram for malignant neoplasm of breast: Secondary | ICD-10-CM | POA: Diagnosis not present

## 2022-07-20 DIAGNOSIS — Z780 Asymptomatic menopausal state: Secondary | ICD-10-CM | POA: Diagnosis not present

## 2023-07-12 ENCOUNTER — Other Ambulatory Visit: Payer: Self-pay | Admitting: Family Medicine

## 2023-07-12 DIAGNOSIS — Z1231 Encounter for screening mammogram for malignant neoplasm of breast: Secondary | ICD-10-CM

## 2023-07-12 DIAGNOSIS — I10 Essential (primary) hypertension: Secondary | ICD-10-CM | POA: Diagnosis not present

## 2023-07-12 DIAGNOSIS — J301 Allergic rhinitis due to pollen: Secondary | ICD-10-CM | POA: Diagnosis not present

## 2023-07-22 ENCOUNTER — Ambulatory Visit
Admission: RE | Admit: 2023-07-22 | Discharge: 2023-07-22 | Disposition: A | Discharge status: Home / Self Care | Source: Ambulatory Visit | Attending: Family Medicine | Admitting: Family Medicine

## 2023-07-22 DIAGNOSIS — Z1231 Encounter for screening mammogram for malignant neoplasm of breast: Secondary | ICD-10-CM
